# Patient Record
Sex: Female | Born: 1995 | Hispanic: Yes | Marital: Single | Smoking: Never smoker
Health system: Southern US, Community
[De-identification: ages and names within clinical notes are randomized; demographics above are authoritative.]

## PROBLEM LIST (undated history)

## (undated) ENCOUNTER — Inpatient Hospital Stay (HOSPITAL_COMMUNITY): Payer: Self-pay

## (undated) DIAGNOSIS — Z789 Other specified health status: Secondary | ICD-10-CM

## (undated) DIAGNOSIS — O24419 Gestational diabetes mellitus in pregnancy, unspecified control: Secondary | ICD-10-CM

## (undated) DIAGNOSIS — E785 Hyperlipidemia, unspecified: Secondary | ICD-10-CM

## (undated) HISTORY — PX: NO PAST SURGERIES: SHX2092

## (undated) HISTORY — DX: Other specified health status: Z78.9

---
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
## 2020-03-11 LAB — OB RESULTS CONSOLE HIV ANTIBODY (ROUTINE TESTING): HIV: NONREACTIVE

## 2020-03-11 LAB — OB RESULTS CONSOLE HEPATITIS B SURFACE ANTIGEN: Hepatitis B Surface Ag: NEGATIVE

## 2020-03-11 LAB — OB RESULTS CONSOLE RUBELLA ANTIBODY, IGM: Rubella: IMMUNE

## 2020-03-11 LAB — HEPATITIS C ANTIBODY: HCV Ab: NEGATIVE

## 2020-03-12 ENCOUNTER — Other Ambulatory Visit: Payer: Self-pay | Admitting: Obstetrics & Gynecology

## 2020-03-12 DIAGNOSIS — Z36 Encounter for antenatal screening for chromosomal anomalies: Secondary | ICD-10-CM

## 2020-03-15 ENCOUNTER — Ambulatory Visit: Payer: Self-pay | Attending: Obstetrics and Gynecology

## 2020-03-15 ENCOUNTER — Ambulatory Visit: Payer: Self-pay | Admitting: Genetic Counselor

## 2020-03-15 ENCOUNTER — Ambulatory Visit: Payer: Self-pay | Admitting: *Deleted

## 2020-03-15 ENCOUNTER — Other Ambulatory Visit: Payer: Self-pay

## 2020-03-15 VITALS — BP 119/69 | HR 76

## 2020-03-15 DIAGNOSIS — G40909 Epilepsy, unspecified, not intractable, without status epilepticus: Secondary | ICD-10-CM

## 2020-03-15 DIAGNOSIS — Z87898 Personal history of other specified conditions: Secondary | ICD-10-CM

## 2020-03-15 DIAGNOSIS — O99352 Diseases of the nervous system complicating pregnancy, second trimester: Secondary | ICD-10-CM

## 2020-03-15 DIAGNOSIS — Z363 Encounter for antenatal screening for malformations: Secondary | ICD-10-CM

## 2020-03-15 DIAGNOSIS — O0990 Supervision of high risk pregnancy, unspecified, unspecified trimester: Secondary | ICD-10-CM | POA: Insufficient documentation

## 2020-03-15 DIAGNOSIS — Z3A20 20 weeks gestation of pregnancy: Secondary | ICD-10-CM

## 2020-03-15 DIAGNOSIS — Z36 Encounter for antenatal screening for chromosomal anomalies: Secondary | ICD-10-CM | POA: Insufficient documentation

## 2020-03-15 DIAGNOSIS — O321XX0 Maternal care for breech presentation, not applicable or unspecified: Secondary | ICD-10-CM

## 2020-03-15 DIAGNOSIS — I10 Essential (primary) hypertension: Secondary | ICD-10-CM

## 2020-03-15 DIAGNOSIS — O10912 Unspecified pre-existing hypertension complicating pregnancy, second trimester: Secondary | ICD-10-CM

## 2020-03-15 NOTE — Progress Notes (Signed)
Ms. Wanda Palmer declined a formal genetic counseling consultation today. However, we did briefly discuss the reason she was referred for genetic counseling. Ms. Wanda Palmer was referred for a personal history of seizures. She reported having a few seizures several years ago prior to her first pregnancy. Per records, she also had a history of hypertension around that time. She was treated with anti-epileptics for approximately one year. She has not had any seizures since that time and is no longer taking medications.   Seizures can occur secondary to a variety of environmental, lifestyle, and genetic factors. Recent research has suggested that hypertension may be a cause of seizures in some individuals Wanda Palmer al., 2019). When epilepsy does not have an identified genetic cause, the chance that a child of an affected mother will also have or develop epilepsy is approximately 4%. However, without knowing the exact etiology of Wanda Palmer's seizures, precise risk assessment is limited.  I asked Ms. Wanda Palmer and her partner, Wanda Palmer, if they had any other personal or family history concerns that they would like to review. Mr. Wanda Palmer reported that his father died of a heart attack in his 59s. We discussed that, like seizures, many forms of heart disease are multifactorial in nature, occurring due to a combination of genetic, lifestyle, and environmental factors. Known risk factors for heart attacks include substance abuse, smoking, hypertension, high cholesterol, lack of physical activity, diabetes, and poor diet. However, some families appear to have a strong family history of heart disease. Given that no one else in the family has a history of heart disease or heart attacks, Wanda Palmer's father's heart attack was likely multifactorial in nature. For this reason, precise recurrence risk for Mr. Wanda Palmer and his children cannot be determined.   I provided Ms. Wanda Palmer  with my card and encouraged her to contact me if any further questions or concerns arise. She and her partner confirmed that they had no further questions for me today. Today's discussion was facilitated by a [facility] Spanish interpreter.

## 2020-06-26 ENCOUNTER — Encounter (HOSPITAL_COMMUNITY): Payer: Self-pay | Admitting: Obstetrics and Gynecology

## 2020-06-26 ENCOUNTER — Other Ambulatory Visit: Payer: Self-pay

## 2020-06-26 ENCOUNTER — Inpatient Hospital Stay (HOSPITAL_COMMUNITY)
Admission: AD | Admit: 2020-06-26 | Discharge: 2020-06-26 | Disposition: A | Discharge status: Home / Self Care | Payer: Self-pay | Attending: Obstetrics and Gynecology | Admitting: Obstetrics and Gynecology

## 2020-06-26 DIAGNOSIS — O479 False labor, unspecified: Secondary | ICD-10-CM

## 2020-06-26 DIAGNOSIS — O4703 False labor before 37 completed weeks of gestation, third trimester: Secondary | ICD-10-CM

## 2020-06-26 DIAGNOSIS — O26893 Other specified pregnancy related conditions, third trimester: Secondary | ICD-10-CM

## 2020-06-26 DIAGNOSIS — Z3A35 35 weeks gestation of pregnancy: Secondary | ICD-10-CM

## 2020-06-26 DIAGNOSIS — N898 Other specified noninflammatory disorders of vagina: Secondary | ICD-10-CM

## 2020-06-26 DIAGNOSIS — R102 Pelvic and perineal pain: Secondary | ICD-10-CM | POA: Insufficient documentation

## 2020-06-26 DIAGNOSIS — Z7982 Long term (current) use of aspirin: Secondary | ICD-10-CM | POA: Insufficient documentation

## 2020-06-26 LAB — POCT FERN TEST: POCT Fern Test: NEGATIVE

## 2020-06-26 NOTE — MAU Note (Signed)
. °  Wanda Palmer is a 24 y.o. at [redacted]w[redacted]d here in MAU reporting: she had LOF after she got out of the shower about an hour ago. Denies any pain..   Onset of complaint: an hour Pain score: 0 Vitals:   06/26/20 2040  BP: 133/77  Pulse: 89  Resp: 16  Temp: 98.5 F (36.9 C)     FHT:135 Lab orders placed from triage:

## 2020-06-26 NOTE — Discharge Instructions (Signed)
Contracciones de Braxton Hicks °Braxton Hicks Contractions °Las contracciones del útero pueden presentarse durante todo el embarazo, pero no siempre indican que la mujer está de parto. Es posible que usted haya tenido contracciones de práctica llamadas "contracciones de Braxton Hicks". A veces, se las confunde con el parto real. °¿Qué son las contracciones de Braxton Hicks? °Las contracciones de Braxton Hicks son espasmos que se producen en los músculos del útero antes del parto. A diferencia de las contracciones del parto verdadero, estas no producen el agrandamiento (la dilatación) ni el afinamiento del cuello uterino. Hacia el final del embarazo (entre las semanas 32 y 34), las contracciones de Braxton Hicks pueden presentarse más seguido y tornarse más intensas. A veces, resulta difícil distinguirlas del parto verdadero porque pueden ser muy molestas. No debe sentirse avergonzada si concurre al hospital con falso parto. °En ocasiones, la única forma de saber si el trabajo de parto es verdadero es que el médico determine si hay cambios en el cuello del útero. El médico le hará un examen físico y quizás le controle las contracciones. Si usted no está de parto verdadero, el examen debe indicar que el cuello uterino no está dilatado y que usted no ha roto bolsa. °Si no hay otros problemas de salud asociados con su embarazo, no habrá inconvenientes si la envían a su casa con un falso parto. Es posible que las contracciones de Braxton Hicks continúen hasta que se desencadene el parto verdadero. °Cómo diferenciar el trabajo de parto falso del verdadero °Trabajo de parto verdadero °· Las contracciones duran de 30 a 70 segundos. °· Las contracciones pueden tornarse muy regulares. °· La molestia generalmente se siente en la parte superior del útero y se extiende hacia la zona baja del abdomen y hacia la cintura. °· Las contracciones no desaparecen cuando usted camina. °· Las contracciones generalmente se hacen más  intensas y aumentan en frecuencia. °· El cuello uterino se dilata y se afina. °Parto falso °· En general, las contracciones son más cortas y no tan intensas como las del parto verdadero. °· En general, las contracciones son irregulares. °· A menudo, las contracciones se sienten en la parte delantera de la parte baja del abdomen y en la ingle. °· Las contracciones pueden desaparecer cuando usted camina o cambia de posición mientras está acostada. °· Las contracciones se vuelven más débiles y su duración es menor a medida que transcurre el tiempo. °· En general, el cuello uterino no se dilata ni se afina. °Siga estas indicaciones en su casa: ° °· Tome los medicamentos de venta libre y los recetados solamente como se lo haya indicado el médico. °· Continúe haciendo los ejercicios habituales y siga las demás indicaciones que el médico le dé. °· Coma y beba con moderación si cree que está de parto. °· Si las contracciones de Braxton Hicks le provocan incomodidad: °? Cambie de posición: si está acostada o descansando, camine; si está caminando, descanse. °? Siéntese y descanse en una bañera con agua tibia. °? Beba suficiente líquido como para mantener la orina de color amarillo pálido. La deshidratación puede provocar contracciones. °? Respire lenta y profundamente varias veces por hora. °· Vaya a todas las visitas de control prenatales y de control como se lo haya indicado el médico. Esto es importante. °Comuníquese con un médico si: °· Tiene fiebre. °· Siente dolor constante en el abdomen. °Solicite ayuda de inmediato si: °· Las contracciones se intensifican, se hacen más regulares y cercanas entre sí. °· Tiene una pérdida de líquido por la vagina. °· Elimina   una mucosidad sanguinolenta (prdida del tapn mucoso).  Tiene una hemorragia vaginal.  Tiene un dolor en la zona lumbar que nunca tuvo antes.  Siente que la cabeza del beb empuja hacia abajo y ejerce presin en la zona plvica.  El beb no se mueve tanto  como antes. Resumen  Las [employer] se presentan antes del parto se conocen como contracciones de [place] parto o contracciones de Multimedia programmer.  En general, las contracciones de [address] son ms cortas, ms dbiles, con ms tiempo entre una y [place], y menos regulares que las contracciones del parto verdadero. Las contracciones del parto verdadero se intensifican progresivamente y se tornan regulares y ms frecuentes.  Para controlar la [employer] producen las contracciones de [place], puede cambiar de posicin, darse un bao templado y Lawyer, beber mucha agua o practicar la respiracin profunda. Esta informacin no tiene Theme park manager el consejo del mdico. Asegrese de hacerle al mdico cualquier pregunta que tenga. Document Revised: 12/14/2017 Document Reviewed: 04/16/2017 Elsevier Patient Education  2020 [employer].  Informacin sobre parto y Passenger transport manager de parto prematuros Preterm Labor and Birth Information El embarazo tiene generalmente una duracin de 39 a 41 semanas. El [place] de parto es prematuro cuando se inicia muy pronto. Comienza antes de completar las 37 semanas de [place]. Cules son los factores de riesgo del [place] prematuro? Existen mayores probabilidades de trabajo de parto prematuro en mujeres con las siguientes caractersticas:  Tuvieron una infeccin Academic librarian.  El cuello uterino es corto.  Tuvieron trabajo de parto prematuro anteriormente.  Se sometieron a una ciruga en el cuello uterino.  Son menores de 17aos.  Tienen ms de 35aos.  Son afroamericanas.  Estn embarazadas de dos o ms bebs.  Consumen drogas mientras estn embarazadas.  Fuman mientras estn embarazadas.  No aumentan de peso lo suficiente durante el [employer].  Se embarazaron inmediatamente despus de Nurse, mental health. Cules son los sntomas del [place] de [place] prematuro? Los sntomas del trabajo de parto prematuro  incluyen lo siguiente:  Educational psychologist. Los calambres pueden parecerse a los que tiene una mujer durante el perodo menstrual. Los calambres pueden presentarse con diarrea.  Dolor de vientre (abdomen).  Dolor en la zona lumbar.  Tiene contracciones regulares o endurecimiento del tero. Siente como si el vientre se endurece.  Presin en la zona inferior del vientre que Advertising account executive.  Pierde ms lquido (secrecin) por la vagina. El lquido puede ser acuoso o con [place].  Ruptura de la bolsa de aguas. Por qu es importante notar los signos del [place] de [place] prematuro? Los bebs que nacen antes de tiempo pueden no estar completamente desarrollados. Estos pueden tener un riesgo mayor de padecer:  Problemas cardacos a Air cabin crew.  Problemas pulmonares a Air cabin crew.  Dificultades para controlar los sistemas corporales, por ejemplo, respirar.  Hemorragia cerebral.  Una afeccin que se denomina parlisis cerebral.  Dificultades en el aprendizaje.  Muerte. Estos riesgos son [employer] para bebs que nacen antes de las 34semanas de [place]. Cmo se trata [place]ie [place]ike de parto prematuro? El tratamiento depende de lo siguiente:  El tiempo de [place].  Su estado de [place].  La salud del beb. El tratamiento puede incluir lo siguiente:  Un punto (sutura) en el cuello uterino. Al parir, el cuello uterino se abre para que el beb pueda salir. El punto impide que el cuello uterino se abra antes de [place].  Permanecer en el hospital.  Tomar medicamentos como, por ejemplo: ?  Medicamentos hormonales. ? Medicamentos para TEFL teacher las contracciones. ? Medicamentos para ayudar a la maduracin de los pulmones del beb. ? Medicamentos para evitar que el beb desarrolle parlisis cerebral. Qu debo hacer si estoy en [place] prematuro? Si cree que est en trabajo de parto demasiado pronto, llame a su mdico de inmediato. Cmo puedo prevenir el trabajo de parto  prematuro?  No use productos que contengan tabaco. ? Estos incluyen cigarrillos, tabaco para Theatre manager y Administrator, Civil Service. ? Si necesita ayuda para dejar de fumar, consulte al mdico.  No consuma drogas.  No tome ningn medicamento si el mdico no se lo indic.  Consulte al mdico antes de empezar a tomar cualquier suplemento de hierbas.  Asegrese aumentar de peso como corresponde.  Tenga cuidado con las infecciones. Si cree que puede tener una infeccin, consulte al mdico para que la revisen inmediatamente.  Infrmele al mdico si ha tenido trabajo de parto prematuro anteriormente. Esta informacin no tiene Theme park manager el consejo del mdico. Asegrese de hacerle al mdico cualquier pregunta que tenga. Document Revised: 12/13/2016 Document Reviewed: 01/26/2016 Elsevier Patient Education  2020 [employer].

## 2020-06-26 NOTE — MAU Provider Note (Signed)
Chief Complaint:  Rupture of Membranes   First Provider Initiated Contact with Patient 06/26/20 2105     HPI: Wanda Palmer is a 24 y.o. G2P1001 at 72w2dwho presents to maternity admissions reporting feeling like she was leaking in the shower.  States shower was one temperature and a different temperature was felt on her leg.  Also reports mild pain in pelvis.  States pain goes away when she lies on her side. . She reports good fetal movement, denies vaginal bleeding, vaginal itching/burning, urinary symptoms, h/a, dizziness, n/v, diarrhea, constipation or fever/chills.    Vaginal Discharge The patient's primary symptoms include pelvic pain and vaginal discharge. The patient's pertinent negatives include no genital itching, genital lesions, genital odor or vaginal bleeding. This is a new problem. The current episode started today. The problem occurs intermittently. The pain is mild. She is pregnant. Associated symptoms include abdominal pain (intermittent). Pertinent negatives include no chills, constipation, diarrhea, dysuria, fever, nausea or vomiting. The vaginal discharge was clear. There has been no bleeding. She has not been passing clots. She has not been passing tissue. Nothing aggravates the symptoms. She has tried nothing for the symptoms.    RN Note Wanda Palmer is a 24 y.o. at [redacted]w[redacted]d here in MAU reporting: she had LOF after she got out of the shower about an hour ago. Denies any pain..  Past Medical History: Past Medical History:  Diagnosis Date  . Medical history non-contributory     Past obstetric history: OB History  Gravida Para Term Preterm AB Living  2 1 1     1   SAB TAB Ectopic Multiple Live Births          1    # Outcome Date GA Lbr Len/2nd Weight Sex Delivery Anes PTL Lv  2 Current           1 Term     M Vag-Spont   LIV    Past Surgical History: Past Surgical History:  Procedure Laterality Date  . NO PAST SURGERIES      Family  History: No family history on file.  Social History: Social History   Tobacco Use  . Smoking status: Never Smoker  Substance Use Topics  . Alcohol use: Never  . Drug use: Never    Allergies: No Known Allergies  Meds:  Medications Prior to Admission  Medication Sig Dispense Refill Last Dose  . aspirin 81 MG chewable tablet Chew 81 mg by mouth daily.     . Prenatal Vit-Fe Fumarate-FA (PRENATAL MULTIVITAMIN) TABS tablet Take 1 tablet by mouth daily at 12 noon.       I have reviewed patient's Past Medical Hx, Surgical Hx, Family Hx, Social Hx, medications and allergies.   ROS:  Review of Systems  Constitutional: Negative for chills and fever.  Gastrointestinal: Positive for abdominal pain (intermittent). Negative for constipation, diarrhea, nausea and vomiting.  Genitourinary: Positive for pelvic pain and vaginal discharge. Negative for dysuria.   Other systems negative  Physical Exam   Patient Vitals for the past 24 hrs:  BP Temp Pulse Resp Weight  06/26/20 2040 133/77 98.5 F (36.9 C) 89 16 86.2 kg   Constitutional: Well-developed, well-nourished female in no acute distress.  Cardiovascular: normal rate and rhythm Respiratory: normal effort, clear to auscultation bilaterally GI: Abd soft, non-tender, gravid appropriate for gestational age.   No rebound or guarding. MS: Extremities nontender, no edema, normal ROM Neurologic: Alert and oriented x 4.  GU: Neg CVAT.  PELVIC EXAM:  Cervix pink, visually closed, without lesion, scant white creamy discharge, vaginal walls and external genitalia normal     No pooling, no ferning   Dilation: Closed Effacement (%): 50 Cervical Position: Posterior Exam by:: Mayford Knife CNM   FHT:  Baseline 140 , moderate variability, accelerations present, no decelerations Contractions: Irregular, not felt as strong by the patient   Labs: No results found for this or any previous visit (from the past 24 hour(s)).    Imaging:  No results  found.  MAU Course/MDM: Evaluation is negative for ferning and pooling NST reviewed, reactive  Treatments in MAU included EFM, SSE.    Assessment: Single IUP at [redacted]w[redacted]d Vaginal discharge, no evidence of PPROM Braxton Hicks contractions, no change in cervix  Plan: Discharge home Preterm Labor precautions and fetal kick counts Follow up in Office for prenatal visits  Encouraged to return here or to other Urgent Care/ED if she develops worsening of symptoms, increase in pain, fever, or other concerning symptoms.   Pt stable at time of discharge.  Wynelle Bourgeois CNM, MSN Certified Nurse-Midwife 06/26/2020 9:06 PM

## 2020-07-05 LAB — OB RESULTS CONSOLE GBS: GBS: NEGATIVE

## 2020-07-16 ENCOUNTER — Encounter (HOSPITAL_COMMUNITY): Payer: Self-pay | Admitting: Obstetrics & Gynecology

## 2020-07-16 ENCOUNTER — Inpatient Hospital Stay (HOSPITAL_COMMUNITY)
Admission: AD | Admit: 2020-07-16 | Discharge: 2020-07-16 | Disposition: A | Discharge status: Home / Self Care | Payer: Self-pay | Attending: Obstetrics & Gynecology | Admitting: Obstetrics & Gynecology

## 2020-07-16 ENCOUNTER — Other Ambulatory Visit: Payer: Self-pay

## 2020-07-16 DIAGNOSIS — Z3A38 38 weeks gestation of pregnancy: Secondary | ICD-10-CM

## 2020-07-16 DIAGNOSIS — O471 False labor at or after 37 completed weeks of gestation: Secondary | ICD-10-CM

## 2020-07-16 DIAGNOSIS — Z3689 Encounter for other specified antenatal screening: Secondary | ICD-10-CM

## 2020-07-16 DIAGNOSIS — O4703 False labor before 37 completed weeks of gestation, third trimester: Secondary | ICD-10-CM

## 2020-07-16 DIAGNOSIS — O26893 Other specified pregnancy related conditions, third trimester: Secondary | ICD-10-CM

## 2020-07-16 DIAGNOSIS — G44209 Tension-type headache, unspecified, not intractable: Secondary | ICD-10-CM

## 2020-07-16 DIAGNOSIS — O99353 Diseases of the nervous system complicating pregnancy, third trimester: Secondary | ICD-10-CM | POA: Insufficient documentation

## 2020-07-16 LAB — WET PREP, GENITAL
Clue Cells Wet Prep HPF POC: NONE SEEN
Sperm: NONE SEEN
Trich, Wet Prep: NONE SEEN
Yeast Wet Prep HPF POC: NONE SEEN

## 2020-07-16 LAB — POCT FERN TEST: POCT Fern Test: NEGATIVE

## 2020-07-16 MED ORDER — PROMETHAZINE HCL 25 MG/ML IJ SOLN
12.5000 mg | Freq: Once | INTRAMUSCULAR | Status: AC
  Administered 2020-07-16: 12.5 mg via INTRAVENOUS
  Filled 2020-07-16: qty 1

## 2020-07-16 MED ORDER — DIPHENHYDRAMINE HCL 50 MG/ML IJ SOLN
25.0000 mg | Freq: Once | INTRAMUSCULAR | Status: AC
  Administered 2020-07-16: 25 mg via INTRAVENOUS
  Filled 2020-07-16: qty 1

## 2020-07-16 MED ORDER — SODIUM CHLORIDE 0.9 % IV BOLUS
500.0000 mL | Freq: Once | INTRAVENOUS | Status: AC
  Administered 2020-07-16: 500 mL via INTRAVENOUS

## 2020-07-16 MED ORDER — DEXAMETHASONE SODIUM PHOSPHATE 10 MG/ML IJ SOLN
10.0000 mg | Freq: Once | INTRAMUSCULAR | Status: AC
  Administered 2020-07-16: 10 mg via INTRAVENOUS
  Filled 2020-07-16: qty 1

## 2020-07-16 NOTE — MAU Provider Note (Signed)
Chief Complaint:  Headache, Contractions, and Rupture of Membranes   None    HPI: Wanda Palmer is a 24 y.o. G2P1001 at [redacted]w[redacted]d who presents to maternity admissions reporting severe headache and pelvic pressure, both began earlier today. She also reports she is leaking fluid and has been all day.   Past Medical History:  Diagnosis Date  . Medical history non-contributory    OB History  Gravida Para Term Preterm AB Living  2 1 1  0 0 1  SAB TAB Ectopic Multiple Live Births  0 0 0   1    # Outcome Date GA Lbr Len/2nd Weight Sex Delivery Anes PTL Lv  2 Current           1 Term     M Vag-Spont   LIV   Past Surgical History:  Procedure Laterality Date  . NO PAST SURGERIES     History reviewed. No pertinent family history. Social History   Tobacco Use  . Smoking status: Never Smoker  . Smokeless tobacco: Never Used  Vaping Use  . Vaping Use: Never used  Substance Use Topics  . Alcohol use: Never  . Drug use: Never   Allergies  Allergen Reactions  . Pork-Derived Products    Medications Prior to Admission  Medication Sig Dispense Refill Last Dose  . acetaminophen (TYLENOL) 325 MG tablet Take 650 mg by mouth every 6 (six) hours as needed.    at 1300  . Prenatal Vit-Fe Fumarate-FA (PRENATAL MULTIVITAMIN) TABS tablet Take 1 tablet by mouth daily at 12 noon.   07/16/2020 at Unknown time  . Prenatal Vit-Fe Fumarate-FA (PRENATAL VITAMIN PO) Take by mouth.   07/16/2020 at Unknown time  . aspirin 81 MG chewable tablet Chew 81 mg by mouth daily.   More than a month at Unknown time    I have reviewed patient's Past Medical Hx, Surgical Hx, Family Hx, Social Hx, medications and allergies.   ROS:  Review of Systems  Constitutional: Negative for fatigue and fever.  HENT: Negative for congestion and sore throat.   Eyes: Negative for visual disturbance.  Respiratory: Negative for shortness of breath.   Gastrointestinal: Negative for constipation, diarrhea and nausea.   Genitourinary: Positive for pelvic pain and vaginal discharge (watery fluid). Negative for vaginal bleeding.  Neurological: Positive for headaches.  All other systems reviewed and are negative.   Physical Exam   Patient Vitals for the past 24 hrs:  BP Pulse Resp SpO2 Height Weight  07/16/20 1814 -- -- -- 97 % -- --  07/16/20 1611 119/72 99 18 100 % 5' 3.5" (1.613 m) 193 lb 8 oz (87.8 kg)    Constitutional: Well-developed, well-nourished female in no acute distress.  Cardiovascular: normal rate & rhythm, no murmur Respiratory: normal effort, lung sounds clear throughout GI: Abd soft, non-tender, gravid appropriate for gestational age. Pos BS x 4 MS: Extremities nontender, no edema, normal ROM Neurologic: Alert and oriented x 4.  Pelvic: NEFG, physiologic discharge, no blood, cervix clean.   Dilation: Closed Effacement (%): Thick Cervical Position: Posterior Exam by:: [email or web address], RN   Fetal Tracing: reactive Baseline: 135 Variability: moderate Accelerations: present Decelerations: none Toco: UI   Labs: Results for orders placed or performed during the hospital encounter of 07/16/20 (from the past 24 hour(s))  Wet prep, genital     Status: Abnormal   Collection Time: 07/16/20  5:02 PM  Result Value Ref Range   Yeast Wet Prep HPF POC NONE SEEN NONE SEEN  Trich, Wet Prep NONE SEEN NONE SEEN   Clue Cells Wet Prep HPF POC NONE SEEN NONE SEEN   WBC, Wet Prep HPF POC MODERATE (A) NONE SEEN   Sperm NONE SEEN   Fern Test     Status: None   Collection Time: 07/16/20  7:05 PM  Result Value Ref Range   POCT Fern Test Negative = intact amniotic membranes     Imaging:  No results found.  MAU Course: Orders Placed This Encounter  Procedures  . Wet prep, genital  . Fern Test  . Insert peripheral IV  . Discharge patient   Meds ordered this encounter  Medications  . AND Linked Order Group   . diphenhydrAMINE (BENADRYL) injection 25 mg   . dexamethasone (DECADRON)  injection 10 mg  . promethazine (PHENERGAN) injection 12.5 mg  . sodium chloride 0.9 % bolus 500 mL    MDM: Headache cocktail caused some dizziness, but reduced both headache and abdominal discomfort.  Assessment: 1. Acute non intractable tension-type headache   2. False labor after 37 completed weeks of gestation   3. NST (non-stress test) reactive    Plan: Discharge home in stable condition with return precautions.     Follow-up Information    Department, [facility] Go to.   Why: as scheduled for ongoing prenatal care Contact information: [address] [place] [address] [phone]               Allergies as of 07/16/2020      Reactions   Pork-derived Products       Medication List    TAKE these medications   acetaminophen 325 MG tablet Commonly known as: TYLENOL Take 650 mg by mouth every 6 (six) hours as needed.   aspirin 81 MG chewable tablet Chew 81 mg by mouth daily.   prenatal multivitamin Tabs tablet Take 1 tablet by mouth daily at 12 noon.   PRENATAL VITAMIN PO Take by mouth.      Edd Arbour, CNM, MSN, [facility] 07/16/20 7:28 PM

## 2020-07-16 NOTE — Discharge Instructions (Signed)
Contracciones de Braxton Hicks °Braxton Hicks Contractions °Las contracciones del útero pueden presentarse durante todo el embarazo, pero no siempre indican que la mujer está de parto. Es posible que usted haya tenido contracciones de práctica llamadas "contracciones de Braxton Hicks". A veces, se las confunde con el parto real. °¿Qué son las contracciones de Braxton Hicks? °Las contracciones de Braxton Hicks son espasmos que se producen en los músculos del útero antes del parto. A diferencia de las contracciones del parto verdadero, estas no producen el agrandamiento (la dilatación) ni el afinamiento del cuello uterino. Hacia el final del embarazo (entre las semanas 32 y 34), las contracciones de Braxton Hicks pueden presentarse más seguido y tornarse más intensas. A veces, resulta difícil distinguirlas del parto verdadero porque pueden ser muy molestas. No debe sentirse avergonzada si concurre al hospital con falso parto. °En ocasiones, la única forma de saber si el trabajo de parto es verdadero es que el médico determine si hay cambios en el cuello del útero. El médico le hará un examen físico y quizás le controle las contracciones. Si usted no está de parto verdadero, el examen debe indicar que el cuello uterino no está dilatado y que usted no ha roto bolsa. °Si no hay otros problemas de salud asociados con su embarazo, no habrá inconvenientes si la envían a su casa con un falso parto. Es posible que las contracciones de Braxton Hicks continúen hasta que se desencadene el parto verdadero. °Cómo diferenciar el trabajo de parto falso del verdadero °Trabajo de parto verdadero °· Las contracciones duran de 30 a 70 segundos. °· Las contracciones pueden tornarse muy regulares. °· La molestia generalmente se siente en la parte superior del útero y se extiende hacia la zona baja del abdomen y hacia la cintura. °· Las contracciones no desaparecen cuando usted camina. °· Las contracciones generalmente se hacen más  intensas y aumentan en frecuencia. °· El cuello uterino se dilata y se afina. °Parto falso °· En general, las contracciones son más cortas y no tan intensas como las del parto verdadero. °· En general, las contracciones son irregulares. °· A menudo, las contracciones se sienten en la parte delantera de la parte baja del abdomen y en la ingle. °· Las contracciones pueden desaparecer cuando usted camina o cambia de posición mientras está acostada. °· Las contracciones se vuelven más débiles y su duración es menor a medida que transcurre el tiempo. °· En general, el cuello uterino no se dilata ni se afina. °Siga estas indicaciones en su casa: ° °· Tome los medicamentos de venta libre y los recetados solamente como se lo haya indicado el médico. °· Continúe haciendo los ejercicios habituales y siga las demás indicaciones que el médico le dé. °· Coma y beba con moderación si cree que está de parto. °· Si las contracciones de Braxton Hicks le provocan incomodidad: °? Cambie de posición: si está acostada o descansando, camine; si está caminando, descanse. °? Siéntese y descanse en una bañera con agua tibia. °? Beba suficiente líquido como para mantener la orina de color amarillo pálido. La deshidratación puede provocar contracciones. °? Respire lenta y profundamente varias veces por hora. °· Vaya a todas las visitas de control prenatales y de control como se lo haya indicado el médico. Esto es importante. °Comuníquese con un médico si: °· Tiene fiebre. °· Siente dolor constante en el abdomen. °Solicite ayuda de inmediato si: °· Las contracciones se intensifican, se hacen más regulares y cercanas entre sí. °· Tiene una pérdida de líquido por la vagina. °· Elimina   una mucosidad sanguinolenta (pérdida del tapón mucoso). °· Tiene una hemorragia vaginal. °· Tiene un dolor en la zona lumbar que nunca tuvo antes. °· Siente que la cabeza del bebé empuja hacia abajo y ejerce presión en la zona pélvica. °· El bebé no se mueve tanto  como antes. °Resumen °· Las contracciones que se presentan antes del parto se conocen como contracciones de Braxton Hicks, falso parto o contracciones de práctica. °· En general, las contracciones de Braxton Hicks son más cortas, más débiles, con más tiempo entre una y otra, y menos regulares que las contracciones del parto verdadero. Las contracciones del parto verdadero se intensifican progresivamente y se tornan regulares y más frecuentes. °· Para controlar la molestia que producen las contracciones de Braxton Hicks, puede cambiar de posición, darse un baño templado y descansar, beber mucha agua o practicar la respiración profunda. °Esta información no tiene como fin reemplazar el consejo del médico. Asegúrese de hacerle al médico cualquier pregunta que tenga. °Document Revised: 12/14/2017 Document Reviewed: 04/16/2017 °Elsevier Patient Education © 2020 Elsevier Inc. ° °

## 2020-07-16 NOTE — MAU Note (Signed)
Pt feeling dizzy after receiving Benadryl and Phenergan.  Laid back with cool washcloth on forehead.  VSS.  Held Dexamethasone while further assessing patient and speaking through [employer].  Also discussed patient with Edd Arbour, CNM.  Okay with provider and with patient to proceed with Dexamethasone.  Pt remains laying flat, lights down and resting. Call light within reach and pt husband at bedside.  VSS.

## 2020-07-16 NOTE — MAU Note (Signed)
Since this morning has been having pain  In abd, feeling a lot of pressure. ? Leaking fluid- first noted at ~0700.  Having a severe headache, Tylenol has not helped.

## 2020-07-28 ENCOUNTER — Inpatient Hospital Stay (HOSPITAL_COMMUNITY)
Admission: AD | Admit: 2020-07-28 | Discharge: 2020-07-31 | DRG: 806 | Disposition: A | Discharge status: Home / Self Care | Payer: Self-pay | Attending: Family Medicine | Admitting: Family Medicine

## 2020-07-28 ENCOUNTER — Other Ambulatory Visit: Payer: Self-pay

## 2020-07-28 ENCOUNTER — Encounter (HOSPITAL_COMMUNITY): Payer: Self-pay | Admitting: Obstetrics and Gynecology

## 2020-07-28 DIAGNOSIS — D6959 Other secondary thrombocytopenia: Secondary | ICD-10-CM | POA: Diagnosis present

## 2020-07-28 DIAGNOSIS — O134 Gestational [pregnancy-induced] hypertension without significant proteinuria, complicating childbirth: Principal | ICD-10-CM | POA: Diagnosis present

## 2020-07-28 DIAGNOSIS — Z20822 Contact with and (suspected) exposure to covid-19: Secondary | ICD-10-CM | POA: Diagnosis present

## 2020-07-28 DIAGNOSIS — O99113 Other diseases of the blood and blood-forming organs and certain disorders involving the immune mechanism complicating pregnancy, third trimester: Secondary | ICD-10-CM

## 2020-07-28 DIAGNOSIS — Z3689 Encounter for other specified antenatal screening: Secondary | ICD-10-CM

## 2020-07-28 DIAGNOSIS — O133 Gestational [pregnancy-induced] hypertension without significant proteinuria, third trimester: Secondary | ICD-10-CM

## 2020-07-28 DIAGNOSIS — O139 Gestational [pregnancy-induced] hypertension without significant proteinuria, unspecified trimester: Secondary | ICD-10-CM | POA: Diagnosis present

## 2020-07-28 DIAGNOSIS — Z3A39 39 weeks gestation of pregnancy: Secondary | ICD-10-CM

## 2020-07-28 DIAGNOSIS — O9912 Other diseases of the blood and blood-forming organs and certain disorders involving the immune mechanism complicating childbirth: Secondary | ICD-10-CM | POA: Diagnosis present

## 2020-07-28 DIAGNOSIS — D696 Thrombocytopenia, unspecified: Secondary | ICD-10-CM | POA: Diagnosis present

## 2020-07-28 DIAGNOSIS — O99119 Other diseases of the blood and blood-forming organs and certain disorders involving the immune mechanism complicating pregnancy, unspecified trimester: Secondary | ICD-10-CM | POA: Diagnosis present

## 2020-07-28 LAB — TYPE AND SCREEN
ABO/RH(D): B POS
Antibody Screen: NEGATIVE

## 2020-07-28 LAB — COMPREHENSIVE METABOLIC PANEL
ALT: 17 U/L (ref 0–44)
AST: 20 U/L (ref 15–41)
Albumin: 2.8 g/dL — ABNORMAL LOW (ref 3.5–5.0)
Alkaline Phosphatase: 137 U/L — ABNORMAL HIGH (ref 38–126)
Anion gap: 9 (ref 5–15)
BUN: 10 mg/dL (ref 6–20)
CO2: 22 mmol/L (ref 22–32)
Calcium: 8.8 mg/dL — ABNORMAL LOW (ref 8.9–10.3)
Chloride: 108 mmol/L (ref 98–111)
Creatinine, Ser: 0.58 mg/dL (ref 0.44–1.00)
GFR, Estimated: >60 mL/min (ref >60)
Glucose, Bld: 100 mg/dL — ABNORMAL HIGH (ref 70–99)
Potassium: 3.8 mmol/L (ref 3.5–5.1)
Sodium: 139 mmol/L (ref 135–145)
Total Bilirubin: 0.3 mg/dL (ref 0.3–1.2)
Total Protein: 6.1 g/dL — ABNORMAL LOW (ref 6.5–8.1)

## 2020-07-28 LAB — CBC
HCT: 33.9 % — ABNORMAL LOW (ref 36.0–46.0)
Hemoglobin: 11.5 g/dL — ABNORMAL LOW (ref 12.0–15.0)
MCH: 31 pg (ref 26.0–34.0)
MCHC: 33.9 g/dL (ref 30.0–36.0)
MCV: 91.4 fL (ref 80.0–100.0)
Platelets: 140 10*3/uL — ABNORMAL LOW (ref 150–400)
RBC: 3.71 MIL/uL — ABNORMAL LOW (ref 3.87–5.11)
RDW: 12.9 % (ref 11.5–15.5)
WBC: 7.6 10*3/uL (ref 4.0–10.5)
nRBC: 0 % (ref 0.0–0.2)

## 2020-07-28 LAB — PROTEIN / CREATININE RATIO, URINE
Creatinine, Urine: 124.07 mg/dL
Protein Creatinine Ratio: 0.17 mg/mg{Cre} — ABNORMAL HIGH (ref 0.00–0.15)
Total Protein, Urine: 21 mg/dL

## 2020-07-28 LAB — RESPIRATORY PANEL BY RT PCR (FLU A&B, COVID)
Influenza A by PCR: NEGATIVE
Influenza B by PCR: NEGATIVE
SARS Coronavirus 2 by RT PCR: NEGATIVE

## 2020-07-28 MED ORDER — TERBUTALINE SULFATE 1 MG/ML IJ SOLN: 0.2500 mg | Freq: Once | INTRAMUSCULAR | Status: DC | PRN

## 2020-07-28 MED ORDER — ACETAMINOPHEN 325 MG PO TABS: 650.0000 mg | ORAL_TABLET | ORAL | Status: DC | PRN

## 2020-07-28 MED ORDER — OXYTOCIN BOLUS FROM INFUSION
333.0000 mL | Freq: Once | INTRAVENOUS | Status: AC
  Administered 2020-07-29: 333 mL via INTRAVENOUS

## 2020-07-28 MED ORDER — OXYCODONE-ACETAMINOPHEN 5-325 MG PO TABS: 1.0000 | ORAL_TABLET | ORAL | Status: DC | PRN

## 2020-07-28 MED ORDER — OXYCODONE-ACETAMINOPHEN 5-325 MG PO TABS: 2.0000 | ORAL_TABLET | ORAL | Status: DC | PRN

## 2020-07-28 MED ORDER — ONDANSETRON HCL 4 MG/2ML IJ SOLN: 4.0000 mg | Freq: Four times a day (QID) | INTRAMUSCULAR | Status: DC | PRN

## 2020-07-28 MED ORDER — OXYTOCIN-SODIUM CHLORIDE 30-0.9 UT/500ML-% IV SOLN
1.0000 m[IU]/min | INTRAVENOUS | Status: DC
  Administered 2020-07-28: 2 m[IU]/min via INTRAVENOUS
  Filled 2020-07-28: qty 500

## 2020-07-28 MED ORDER — LIDOCAINE HCL (PF) 1 % IJ SOLN
30.0000 mL | INTRAMUSCULAR | Status: AC | PRN
  Administered 2020-07-29: 30 mL via SUBCUTANEOUS
  Filled 2020-07-28: qty 30

## 2020-07-28 MED ORDER — LACTATED RINGERS IV SOLN: INTRAVENOUS | Status: DC

## 2020-07-28 MED ORDER — FENTANYL CITRATE (PF) 100 MCG/2ML IJ SOLN: 100.0000 ug | INTRAMUSCULAR | Status: DC | PRN

## 2020-07-28 MED ORDER — SOD CITRATE-CITRIC ACID 500-334 MG/5ML PO SOLN: 30.0000 mL | ORAL | Status: DC | PRN

## 2020-07-28 MED ORDER — LACTATED RINGERS IV SOLN
500.0000 mL | INTRAVENOUS | Status: DC | PRN
  Administered 2020-07-29: 1000 mL via INTRAVENOUS
  Administered 2020-07-29 (×2): 500 mL via INTRAVENOUS

## 2020-07-28 MED ORDER — OXYTOCIN-SODIUM CHLORIDE 30-0.9 UT/500ML-% IV SOLN
2.5000 [IU]/h | INTRAVENOUS | Status: DC
  Administered 2020-07-29: 2.5 [IU]/h via INTRAVENOUS

## 2020-07-28 NOTE — MAU Provider Note (Signed)
History     CSN: [id number]  Arrival date and time: 07/28/20 1441   First Provider Initiated Contact with Patient 07/28/20 1534      Chief Complaint  Patient presents with  . Chills  . Shortness of Breath   Wanda Palmer is a 24 y.o. G2P1001 at 623w6d who presents to MAU for cold sweats, shaking hands, dizziness and her bottom lip moving towards the right without her knowing. Patient reports hx of panic attacks precipitated by headaches about 5 years ago.  Patient reports these symptoms came on around 1pm and reports symptoms lasted about 1 hour, and the issue with her mouth lasted 3 minutes before resolving. Patient reports at this time she is currently experiencing a minor amount of dizziness, but denies other symptoms. Pt reports she called her husband when these symptoms were happening and both deny any difficulties with speech. Patient reports she did not actually see her lip deviate to the right and no other witnesses were present at home at this time. Pt reports prior to this incidence she last ate around 11AM and ate grapes and prior to that she ate cookies and coffee at 9AM. At 115PM patient ate pizza and coke and reports most symptoms resolved. Patient and husband reports she drinks multiple cups of caffeinated coffee per day along with multiple glasses of coke.  Pt denies VB, LOF, ctx, decreased FM, vaginal discharge/odor/itching. Pt denies N/V, abdominal pain, constipation, diarrhea, or urinary problems. Pt denies fever, chills, fatigue, sweating or changes in appetite. Pt denies SOB or chest pain. Pt denies HA, light-headedness, weakness.  Problems this pregnancy include: none. Allergies? pork Current medications/supplements? PNVs Prenatal care provider? [facility], next appt 07/30/2020  Spanish translator used for entire visit.   OB History    Gravida  2   Para  1   Term  1   Preterm  0   AB  0   Living  1     SAB  0   TAB  0   Ectopic  0    Multiple      Live Births  1           Past Medical History:  Diagnosis Date  . Medical history non-contributory     Past Surgical History:  Procedure Laterality Date  . NO PAST SURGERIES      History reviewed. No pertinent family history.  Social History   Tobacco Use  . Smoking status: Never Smoker  . Smokeless tobacco: Never Used  Vaping Use  . Vaping Use: Never used  Substance Use Topics  . Alcohol use: Never  . Drug use: Never    Allergies:  Allergies  Allergen Reactions  . Pork-Derived Products     Medications Prior to Admission  Medication Sig Dispense Refill Last Dose  . Prenatal Vit-Fe Fumarate-FA (PRENATAL VITAMIN PO) Take by mouth.   07/28/2020 at Unknown time  . acetaminophen (TYLENOL) 325 MG tablet Take 650 mg by mouth every 6 (six) hours as needed.     Marland Kitchen. aspirin 81 MG chewable tablet Chew 81 mg by mouth daily.   More than a month at Unknown time  . Prenatal Vit-Fe Fumarate-FA (PRENATAL MULTIVITAMIN) TABS tablet Take 1 tablet by mouth daily at 12 noon.       Review of Systems  Constitutional: Negative for chills, diaphoresis, fatigue and fever.  Eyes: Negative for visual disturbance.  Respiratory: Negative for shortness of breath.   Cardiovascular: Negative for chest pain.  Gastrointestinal:  Negative for abdominal pain, constipation, diarrhea, nausea and vomiting.  Genitourinary: Negative for dysuria, flank pain, frequency, pelvic pain, urgency, vaginal bleeding and vaginal discharge.  Neurological: Positive for dizziness. Negative for weakness, light-headedness and headaches.   Physical Exam   Blood pressure 120/73, pulse 91, temperature 98.1 F (36.7 C), resp. rate 15, last menstrual period 10/23/2019, SpO2 100 %.  Patient Vitals for the past 24 hrs:  BP Temp Pulse Resp SpO2  07/28/20 1816 120/73 -- 91 15 100 %  07/28/20 1801 118/74 -- 95 -- --  07/28/20 1750 104/74 -- (!) 110 -- 99 %  07/28/20 1747 (!) 64/44 -- 90 -- --  07/28/20 1722  124/77 -- (!) 102 -- --  07/28/20 1716 (!) 141/116 -- 100 -- --  07/28/20 1701 137/74 -- 86 -- --  07/28/20 1647 116/77 -- 100 -- --  07/28/20 1616 (!) 107/59 -- 89 -- --  07/28/20 1601 123/77 -- 93 -- --  07/28/20 1546 133/90 -- (!) 106 -- --  07/28/20 1531 133/80 -- 100 -- --  07/28/20 1516 131/77 -- 94 -- --  07/28/20 1511 124/73 -- 88 -- --  07/28/20 1501 130/83 -- (!) 112 -- --  07/28/20 1454 (!) 141/76 -- (!) 111 -- --  07/28/20 1452 138/80 98.1 F (36.7 C) (!) 111 20 100 %   Physical Exam Constitutional:      General: She is not in acute distress.    Appearance: Normal appearance. She is not ill-appearing, toxic-appearing or diaphoretic.  HENT:     Head: Normocephalic and atraumatic.  Eyes:     Pupils: Pupils are equal, round, and reactive to light.  Pulmonary:     Effort: Pulmonary effort is normal.  Neurological:     Mental Status: She is alert and oriented to person, place, and time.  Psychiatric:        Mood and Affect: Mood normal.        Behavior: Behavior normal.        Thought Content: Thought content normal.        Judgment: Judgment normal.    Results for orders placed or performed during the hospital encounter of 07/28/20 (from the past 24 hour(s))  CBC     Status: Abnormal   Collection Time: 07/28/20  3:26 PM  Result Value Ref Range   WBC 7.6 4.0 - 10.5 K/uL   RBC 3.71 (L) 3.87 - 5.11 MIL/uL   Hemoglobin 11.5 (L) 12.0 - 15.0 g/dL   HCT 18.8 (L) 36 - 46 %   MCV 91.4 80.0 - 100.0 fL   MCH 31.0 26.0 - 34.0 pg   MCHC 33.9 30.0 - 36.0 g/dL   RDW 41.6 60.6 - 30.1 %   Platelets 140 (L) 150 - 400 K/uL   nRBC 0.0 0.0 - 0.2 %  Comprehensive metabolic panel     Status: Abnormal   Collection Time: 07/28/20  3:26 PM  Result Value Ref Range   Sodium 139 135 - 145 mmol/L   Potassium 3.8 3.5 - 5.1 mmol/L   Chloride 108 98 - 111 mmol/L   CO2 22 22 - 32 mmol/L   Glucose, Bld 100 (H) 70 - 99 mg/dL   BUN 10 6 - 20 mg/dL   Creatinine, Ser 6.01 0.44 - 1.00 mg/dL    Calcium 8.8 (L) 8.9 - 10.3 mg/dL   Total Protein 6.1 (L) 6.5 - 8.1 g/dL   Albumin 2.8 (L) 3.5 - 5.0 g/dL   AST 20 15 -  41 U/L   ALT 17 0 - 44 U/L   Alkaline Phosphatase 137 (H) 38 - 126 U/L   Total Bilirubin 0.3 0.3 - 1.2 mg/dL   GFR, Estimated >69 >62 mL/min   Anion gap 9 5 - 15  Protein / creatinine ratio, urine     Status: Abnormal   Collection Time: 07/28/20  4:46 PM  Result Value Ref Range   Creatinine, Urine 124.07 mg/dL   Total Protein, Urine 21 mg/dL   Protein Creatinine Ratio 0.17 (H) 0.00 - 0.15 mg/mg[Cre]   No results found.  MAU Course  Procedures  MDM -mild dizziness with previous symptoms resolved -elevated BP of 141/76 and 133/90, ?current/hx of cHTN according to prenatal records; single severe range pressure, but arm was bent during taking and repeat was normal -symptoms include: dizziness -CBC: H/H 11.5/33.9, platelets 140 -CMP: serum creatinine 0.58, AST/ALT 20/17 -CBG: 100 -PCr: 0.17 -EFM: reactive       -baseline: 140/135       -variability: moderate       -accels: present, 15x15       -decels: single early, reviewed with Dr. Alvester Morin       -TOCO: few, irregular ctx, pt not feeling -EKG performed as patient originally presented with chest pain but denies to provider, reviewed with Dr. Alvester Morin, [facility] -consulted with Dr. Shawnie Pons, will admit patient for new onset HTN -called and report given to K. Clelia Croft, CNM -admit to L&D  Orders Placed This Encounter  Procedures  . Respiratory Panel by RT PCR (Flu A&B, Covid) - Nasopharyngeal Swab    Standing Status:   Standing    Number of Occurrences:   1    Order Specific Question:   Is this test for diagnosis or screening    Answer:   Screening    Order Specific Question:   Symptomatic for COVID-19 as defined by CDC    Answer:   No    Order Specific Question:   Hospitalized for COVID-19    Answer:   No    Order Specific Question:   Admitted to ICU for COVID-19    Answer:   No    Order Specific Question:    Previously tested for COVID-19    Answer:   No    Order Specific Question:   Resident in a congregate (group) care setting    Answer:   No    Order Specific Question:   Employed in healthcare setting    Answer:   No    Order Specific Question:   Pregnant    Answer:   Yes    Order Specific Question:   Has patient completed COVID vaccination(s) (2 doses of Pfizer/Moderna 1 dose of [employer])    Answer:   No  . CBC    Standing Status:   Standing    Number of Occurrences:   1  . Comprehensive metabolic panel    Standing Status:   Standing    Number of Occurrences:   1  . Protein / creatinine ratio, urine    Standing Status:   Standing    Number of Occurrences:   1  . EKG 12-Lead    Standing Status:   Standing    Number of Occurrences:   1    Order Specific Question:   Reason for Exam    Answer:   shortness of breath, chest pain     Assessment and Plan   1. Gestational hypertension, third trimester  2. [redacted] weeks gestation of pregnancy   3. NST (non-stress test) reactive   4. Benign gestational thrombocytopenia in third trimester [facility])    -admit to L&D   Wanda Palmer Lugenia Assefa 07/28/2020, 7:08 PM

## 2020-07-28 NOTE — MAU Note (Signed)
.   Wanda Palmer is a 24 y.o. at [redacted]w[redacted]d here in MAU reporting: SOB with chills more than an hour. Denies any VB or LOF. +FM  Onset of complaint: a little over an hour Pain score: 0 Vitals:   07/28/20 1452  BP: 138/80  Pulse: (!) 111  Resp: 20  Temp: 98.1 F (36.7 C)  SpO2: 100%     FHT:160 Lab orders placed from triage:

## 2020-07-28 NOTE — H&P (Addendum)
LABOR AND DELIVERY ADMISSION HISTORY AND PHYSICAL NOTE  Wanda Palmer is a 24 y.o. female G2P1001 with IUP at [redacted]w[redacted]d by LMP c/w [place] presenting for IOL 2/2 gHTN.  She reports positive fetal movement. She denies leakage of fluid or vaginal bleeding.  Prenatal History/Complications: PNC at [facility] Pregnancy complications:  - gHTN (plt 140) - Hx of episiotomy with prior pregnancy (delivered in [place]) - Possible hx of seizure disorder - Positive chlamdyia, TOC negative - Failed 1 hr, passed 3hr gtt  Past Medical History: Past Medical History:  Diagnosis Date  . Medical history non-contributory    Past Surgical History: Past Surgical History:  Procedure Laterality Date  . NO PAST SURGERIES     Obstetrical History: OB History    Gravida  2   Para  1   Term  1   Preterm  0   AB  0   Living  1     SAB  0   TAB  0   Ectopic  0   Multiple      Live Births  1          Social History: Social History   Socioeconomic History  . Marital status: Single    Spouse name: Not on file  . Number of children: Not on file  . Years of education: Not on file  . Highest education level: Not on file  Occupational History  . Not on file  Tobacco Use  . Smoking status: Never Smoker  . Smokeless tobacco: Never Used  Vaping Use  . Vaping Use: Never used  Substance and Sexual Activity  . Alcohol use: Never  . Drug use: Never  . Sexual activity: Not on file  Other Topics Concern  . Not on file  Social History Narrative   ** Merged History Encounter **       Social Determinants of Health   Financial Resource Strain:   . Difficulty of Paying Living Expenses: Not on file  Food Insecurity:   . Worried About Programme researcher, broadcasting/film/video in the Last Year: Not on file  . Ran Out of Food in the Last Year: Not on file  Transportation Needs:   . Lack of Transportation (Medical): Not on file  . Lack of Transportation (Non-Medical): Not on file  Physical Activity:   . Days  of Exercise per Week: Not on file  . Minutes of Exercise per Session: Not on file  Stress:   . Feeling of Stress : Not on file  Social Connections:   . Frequency of Communication with Friends and Family: Not on file  . Frequency of Social Gatherings with Friends and Family: Not on file  . Attends Religious Services: Not on file  . Active Member of Clubs or Organizations: Not on file  . Attends Banker Meetings: Not on file  . Marital Status: Not on file   Family History: History reviewed. No pertinent family history.  Allergies: Allergies  Allergen Reactions  . Pork-Derived Products     Medications Prior to Admission  Medication Sig Dispense Refill Last Dose  . Prenatal Vit-Fe Fumarate-FA (PRENATAL VITAMIN PO) Take by mouth.   07/28/2020 at Unknown time  . acetaminophen (TYLENOL) 325 MG tablet Take 650 mg by mouth every 6 (six) hours as needed.     Marland Kitchen aspirin 81 MG chewable tablet Chew 81 mg by mouth daily.   More than a month at Unknown time  . Prenatal Vit-Fe Fumarate-FA (PRENATAL MULTIVITAMIN) TABS  tablet Take 1 tablet by mouth daily at 12 noon.      Review of Systems  All systems reviewed and negative except as stated in HPI  Physical Exam Blood pressure 139/63, pulse 76, temperature 98.1 F (36.7 C), resp. rate 15, height 5\' 3"  (1.6 m), last menstrual period 10/23/2019, SpO2 100 %. General appearance: alert, oriented, NAD Lungs: normal respiratory effort Heart: regular rate Abdomen: soft, non-tender; gravid, FH appropriate for GA Extremities: No calf swelling or tenderness Presentation: cephalic by leopolds Fetal monitoring: 145 bpm, moderate, 15 x15 acels no decels Uterine activity: 4 mins Dilation: 3 Effacement (%): 50 Station: -3 Exam by:: Holly Flippin RN   Prenatal labs: ABO, Rh: --/--/B POS (11/10 1526) Antibody: NEG (11/10 1526) Rubella:  Immune 03/11/20 RPR:  NR 03/11/20, 05/06/20 HBsAg:  NR 03/11/20 HIV:  NR 03/11/20, 05/06/20 GC/Chlamydia:  Positive 03/11/20, TOC Negative 04/08/20, Negative 06/03/20, 07/01/20 GBS:  Negative 07/01/20 GTT: FBS (82), 1 hr (143, 151), 2 hr (120), 3 hr (134) Genetic screening:  Negative Anatomy 07/03/20: Wnl, EFW 89.8 % (3656g), Anterior placenta 07/12/20  Prenatal Transfer Tool  Maternal Diabetes: No Genetic Screening: Normal Maternal Ultrasounds/Referrals: Normal Fetal Ultrasounds or other Referrals:  None Maternal Substance Abuse:  No Significant Maternal Medications:  None Significant Maternal Lab Results: Group B Strep negative  Results for orders placed or performed during the hospital encounter of 07/28/20 (from the past 24 hour(s))  CBC   Collection Time: 07/28/20  3:26 PM  Result Value Ref Range   WBC 7.6 4.0 - 10.5 K/uL   RBC 3.71 (L) 3.87 - 5.11 MIL/uL   Hemoglobin 11.5 (L) 12.0 - 15.0 g/dL   HCT 13/10/21 (L) 36 - 46 %   MCV 91.4 80.0 - 100.0 fL   MCH 31.0 26.0 - 34.0 pg   MCHC 33.9 30.0 - 36.0 g/dL   RDW 70.2 63.7 - 85.8 %   Platelets 140 (L) 150 - 400 K/uL   nRBC 0.0 0.0 - 0.2 %  Comprehensive metabolic panel   Collection Time: 07/28/20  3:26 PM  Result Value Ref Range   Sodium 139 135 - 145 mmol/L   Potassium 3.8 3.5 - 5.1 mmol/L   Chloride 108 98 - 111 mmol/L   CO2 22 22 - 32 mmol/L   Glucose, Bld 100 (H) 70 - 99 mg/dL   BUN 10 6 - 20 mg/dL   Creatinine, Ser 13/10/21 0.44 - 1.00 mg/dL   Calcium 8.8 (L) 8.9 - 10.3 mg/dL   Total Protein 6.1 (L) 6.5 - 8.1 g/dL   Albumin 2.8 (L) 3.5 - 5.0 g/dL   AST 20 15 - 41 U/L   ALT 17 0 - 44 U/L   Alkaline Phosphatase 137 (H) 38 - 126 U/L   Total Bilirubin 0.3 0.3 - 1.2 mg/dL   GFR, Estimated 2.77 >41 mL/min   Anion gap 9 5 - 15  Type and screen [facility]   Collection Time: 07/28/20  3:26 PM  Result Value Ref Range   ABO/RH(D) B POS    Antibody Screen NEG    Sample Expiration      07/31/2020,2359 Performed at [facility], [address] [place], [place]   Protein / creatinine ratio, urine    Collection Time: 07/28/20  4:46 PM  Result Value Ref Range   Creatinine, Urine 124.07 mg/dL   Total Protein, Urine 21 mg/dL   Protein Creatinine Ratio 0.17 (H) 0.00 - 0.15 mg/mg[Cre]  Respiratory Panel  by RT PCR (Flu A&B, Covid) - Nasopharyngeal Swab   Collection Time: 07/28/20  7:02 PM   Specimen: Nasopharyngeal Swab  Result Value Ref Range   SARS Coronavirus 2 by RT PCR NEGATIVE NEGATIVE   Influenza A by PCR NEGATIVE NEGATIVE   Influenza B by PCR NEGATIVE NEGATIVE    Patient Active Problem List   Diagnosis Date Noted  . Gestational hypertension 07/28/2020  . Gestational thrombocytopenia (HCC) 07/28/2020    Assessment: Wanda Palmer is a 24 y.o. G2P1001 at [redacted]w[redacted]d here for IOL 2/2 gHTN.   gHTN BP wnl. Denies headache/upper abdominal pain. P/C 0.17. Plt 140.  -Continue monitor BP   #Labor: Pit (2106) #Pain: Maternally supported #FWB: Cat I #ID: GBS negative #MOF: Br/Bo #MOC: None #Circ: Declined  Wanda Palmer 07/28/2020, 10:14 PM   CNM attestation:  I have seen and examined this patient; I agree with above documentation in the resident's note.   Wanda Palmer is a 24 y.o. G2P1001 here initially for eval of dizziness/panic attack, but then had an elevated BP while in MAU and platelets of 140, so she was admitted ultimately for IOL due to gHTN (had a BP value of 141/116 when her arm was bent).  PE: BP 125/72   Pulse 84   Temp 98.6 F (37 C) (Oral)   Resp 18   Ht 5\' 3"  (1.6 m)   Wt 89.8 kg   LMP 10/23/2019   SpO2 100%   BMI 35.07 kg/m  Gen: calm comfortable, NAD Resp: normal effort, no distress Abd: gravid  ROS, labs, PMH reviewed  Plan: Admit to Labor and Delivery Pitocin started as cx was favorable to start Continue to monitor BPs Anticipate vag del  12/21/2019 CNM 07/29/2020, 12:21 AM

## 2020-07-29 ENCOUNTER — Encounter (HOSPITAL_COMMUNITY): Payer: Self-pay | Admitting: Family Medicine

## 2020-07-29 ENCOUNTER — Inpatient Hospital Stay (HOSPITAL_COMMUNITY): Payer: Self-pay | Admitting: Anesthesiology

## 2020-07-29 DIAGNOSIS — O134 Gestational [pregnancy-induced] hypertension without significant proteinuria, complicating childbirth: Secondary | ICD-10-CM

## 2020-07-29 DIAGNOSIS — Z3A39 39 weeks gestation of pregnancy: Secondary | ICD-10-CM

## 2020-07-29 LAB — CBC WITH DIFFERENTIAL/PLATELET
Abs Immature Granulocytes: 0.02 10*3/uL (ref 0.00–0.07)
Basophils Absolute: 0 10*3/uL (ref 0.0–0.1)
Basophils Relative: 0 %
Eosinophils Absolute: 0.1 10*3/uL (ref 0.0–0.5)
Eosinophils Relative: 2 %
HCT: 33.6 % — ABNORMAL LOW (ref 36.0–46.0)
Hemoglobin: 11.7 g/dL — ABNORMAL LOW (ref 12.0–15.0)
Immature Granulocytes: 0 %
Lymphocytes Relative: 33 %
Lymphs Abs: 2.5 10*3/uL (ref 0.7–4.0)
MCH: 31.7 pg (ref 26.0–34.0)
MCHC: 34.8 g/dL (ref 30.0–36.0)
MCV: 91.1 fL (ref 80.0–100.0)
Monocytes Absolute: 0.4 10*3/uL (ref 0.1–1.0)
Monocytes Relative: 5 %
Neutro Abs: 4.6 10*3/uL (ref 1.7–7.7)
Neutrophils Relative %: 60 %
Platelets: 137 10*3/uL — ABNORMAL LOW (ref 150–400)
RBC: 3.69 MIL/uL — ABNORMAL LOW (ref 3.87–5.11)
RDW: 13 % (ref 11.5–15.5)
WBC: 7.7 10*3/uL (ref 4.0–10.5)
nRBC: 0 % (ref 0.0–0.2)

## 2020-07-29 LAB — RPR: RPR Ser Ql: NONREACTIVE

## 2020-07-29 MED ORDER — ACETAMINOPHEN 325 MG PO TABS: 650.0000 mg | ORAL_TABLET | ORAL | Status: DC | PRN

## 2020-07-29 MED ORDER — TETANUS-DIPHTH-ACELL PERTUSSIS 5-2.5-18.5 LF-MCG/0.5 IM SUSY: 0.5000 mL | PREFILLED_SYRINGE | Freq: Once | INTRAMUSCULAR | Status: DC

## 2020-07-29 MED ORDER — WITCH HAZEL-GLYCERIN EX PADS: 1.0000 "application " | MEDICATED_PAD | CUTANEOUS | Status: DC | PRN

## 2020-07-29 MED ORDER — SIMETHICONE 80 MG PO CHEW: 80.0000 mg | CHEWABLE_TABLET | ORAL | Status: DC | PRN

## 2020-07-29 MED ORDER — DIPHENHYDRAMINE HCL 50 MG/ML IJ SOLN: 12.5000 mg | INTRAMUSCULAR | Status: DC | PRN

## 2020-07-29 MED ORDER — COCONUT OIL OIL
1.0000 "application " | TOPICAL_OIL | Status: DC | PRN
  Administered 2020-07-29: 1 via TOPICAL

## 2020-07-29 MED ORDER — LACTATED RINGERS IV SOLN
500.0000 mL | Freq: Once | INTRAVENOUS | Status: AC
  Administered 2020-07-29: 500 mL via INTRAVENOUS

## 2020-07-29 MED ORDER — EPHEDRINE 5 MG/ML INJ
10.0000 mg | INTRAVENOUS | Status: DC | PRN
  Filled 2020-07-29: qty 2

## 2020-07-29 MED ORDER — ONDANSETRON HCL 4 MG PO TABS: 4.0000 mg | ORAL_TABLET | ORAL | Status: DC | PRN

## 2020-07-29 MED ORDER — SODIUM CHLORIDE (PF) 0.9 % IJ SOLN
INTRAMUSCULAR | Status: DC | PRN
  Administered 2020-07-29: 12 mL/h via EPIDURAL

## 2020-07-29 MED ORDER — SENNOSIDES-DOCUSATE SODIUM 8.6-50 MG PO TABS
2.0000 | ORAL_TABLET | ORAL | Status: DC
  Administered 2020-07-29 – 2020-07-30 (×2): 2 via ORAL
  Filled 2020-07-29 (×2): qty 2

## 2020-07-29 MED ORDER — FENTANYL-BUPIVACAINE-NACL 0.5-0.125-0.9 MG/250ML-% EP SOLN
12.0000 mL/h | EPIDURAL | Status: DC | PRN
  Filled 2020-07-29: qty 250

## 2020-07-29 MED ORDER — DIPHENHYDRAMINE HCL 25 MG PO CAPS: 25.0000 mg | ORAL_CAPSULE | Freq: Four times a day (QID) | ORAL | Status: DC | PRN

## 2020-07-29 MED ORDER — LACTATED RINGERS AMNIOINFUSION: INTRAVENOUS | Status: DC

## 2020-07-29 MED ORDER — PHENYLEPHRINE 40 MCG/ML (10ML) SYRINGE FOR IV PUSH (FOR BLOOD PRESSURE SUPPORT)
80.0000 ug | PREFILLED_SYRINGE | INTRAVENOUS | Status: AC | PRN
  Administered 2020-07-29 (×3): 80 ug via INTRAVENOUS

## 2020-07-29 MED ORDER — PHENYLEPHRINE 40 MCG/ML (10ML) SYRINGE FOR IV PUSH (FOR BLOOD PRESSURE SUPPORT)
80.0000 ug | PREFILLED_SYRINGE | INTRAVENOUS | Status: DC | PRN
  Filled 2020-07-29 (×3): qty 10

## 2020-07-29 MED ORDER — IBUPROFEN 600 MG PO TABS
600.0000 mg | ORAL_TABLET | Freq: Four times a day (QID) | ORAL | Status: DC
  Administered 2020-07-29 – 2020-07-31 (×7): 600 mg via ORAL
  Filled 2020-07-29 (×7): qty 1

## 2020-07-29 MED ORDER — PRENATAL MULTIVITAMIN CH
1.0000 | ORAL_TABLET | Freq: Every day | ORAL | Status: DC
  Administered 2020-07-30 – 2020-07-31 (×2): 1 via ORAL
  Filled 2020-07-29 (×2): qty 1

## 2020-07-29 MED ORDER — ONDANSETRON HCL 4 MG/2ML IJ SOLN: 4.0000 mg | INTRAMUSCULAR | Status: DC | PRN

## 2020-07-29 MED ORDER — DOCUSATE SODIUM 100 MG PO CAPS
100.0000 mg | ORAL_CAPSULE | Freq: Two times a day (BID) | ORAL | Status: DC
  Administered 2020-07-30 – 2020-07-31 (×3): 100 mg via ORAL
  Filled 2020-07-29 (×3): qty 1

## 2020-07-29 MED ORDER — LIDOCAINE-EPINEPHRINE (PF) 2 %-1:200000 IJ SOLN
INTRAMUSCULAR | Status: DC | PRN
  Administered 2020-07-29: 5 mL via EPIDURAL

## 2020-07-29 MED ORDER — BENZOCAINE-MENTHOL 20-0.5 % EX AERO
1.0000 "application " | INHALATION_SPRAY | CUTANEOUS | Status: DC | PRN
  Administered 2020-07-29: 1 via TOPICAL
  Filled 2020-07-29: qty 56

## 2020-07-29 MED ORDER — DIBUCAINE (PERIANAL) 1 % EX OINT: 1.0000 "application " | TOPICAL_OINTMENT | CUTANEOUS | Status: DC | PRN

## 2020-07-29 NOTE — Progress Notes (Signed)
LABOR PROGRESS NOTE  Assata Juncaj is a 24 y.o. G2P1001 at [redacted]w[redacted]d  admitted for IOL 2/2 gHTN.   Subjective: Sitting on side of bed endorsing discomfort with contractions. Would like an epidural at this time.    Objective: BP 121/70   Pulse 71   Temp 98.2 F (36.8 C) (Oral)   Resp 18   Ht 5\' 3"  (1.6 m)   Wt 89.8 kg   LMP 10/23/2019   SpO2 100%   BMI 35.07 kg/m  or  Vitals:   07/29/20 0131 07/29/20 0201 07/29/20 0230 07/29/20 0301  BP: (!) 103/45 (!) 100/52 (!) 116/51 121/70  Pulse: 61 74 62 71  Resp:   18 18  Temp:   98.2 F (36.8 C)   TempSrc:   Oral   SpO2:      Weight:      Height:       Dilation: 4 Effacement (%): 50 Cervical Position: Middle Station: -2 Presentation: Vertex Exam by:: Autry-Lott FHT: baseline rate 135 bpm, moderate varibility, 15 x 15 acel, no decel Toco: 1-3 mins  Labs: Lab Results  Component Value Date   WBC 7.6 07/28/2020   HGB 11.5 (L) 07/28/2020   HCT 33.9 (L) 07/28/2020   MCV 91.4 07/28/2020   PLT 140 (L) 07/28/2020    Patient Active Problem List   Diagnosis Date Noted  . Gestational hypertension 07/28/2020  . Gestational thrombocytopenia (HCC) 07/28/2020    Assessment / Plan: 24 y.o. G2P1001 at [redacted]w[redacted]d here for IOL 2/2 gHTN  gHTN BP wnl -Continue to monitor  Labor: Pit 10 mu/min, consider AROM with next cervical exam Fetal Wellbeing:  Cat I Pain Control:  Planning for epidural Anticipated MOD: Vaginal  Keitha Kolk Autry-Lott, DO 07/29/2020, 4:16 AM PGY-2, [facility]

## 2020-07-29 NOTE — Progress Notes (Addendum)
OB/GYN Faculty Practice Delivery Note  Wanda Palmer is a 24 y.o. G2P1001 s/p IOL at [redacted]w[redacted]d for gHTN.   ROM: 8h 9m with 100cc fluid GBS Status: negative  Maximum Maternal Temperature: 98.18F  Labor Progress: S/p AROM @ 720, pitocin restarted 0930 and IUPC placed around 11:00 due to difficulty titrating pitocin. Amnioinfusion started at 1230 due to variable deceleration noted on NST.   Delivery Date/Time: 07/29/2020 4:20 pm  Delivery: Called to room and patient was complete and pushing. Head delivered first. Nuchal cord present around the baby's neck. Shoulder and body delivered in usual fashion. Infant with spontaneous cry, placed on mother's abdomen, dried and stimulated. Cord clamped x 2 after 1-minute delay, and cut by provider. Cord blood drawn. Placenta delivered spontaneously with gentle cord traction. Fundus firm with massage and Pitocin. Labia, perineum, vagina, and cervix inspected with small peri-clitoral laceration noted, repaired with 3-0 suture.   Placenta: Sent to L&D Complications: None Lacerations: peri-clitoral  EBL: 100 cc Analgesia: Epidural   Postpartum Planning [x]  message to sent to schedule follow-up   Infant: APGARs 6,9   Sul gi Kim  Medical Student  07/29/2020, 4:48 PM   I was present and gloved for the entirety of the delivery. I agree with the findings and the plan of care as documented in the medical student's note.  13/07/2020, MD [facility] Family [facility] Fellow, [facility] for [facility], [facility]

## 2020-07-29 NOTE — Anesthesia Procedure Notes (Signed)
Epidural Patient location during procedure: OB Start time: 07/29/2020 4:55 AM End time: 07/29/2020 5:05 AM  Staffing Anesthesiologist: Elmer Picker, MD Performed: anesthesiologist   Preanesthetic Checklist Completed: patient identified, IV checked, risks and benefits discussed, monitors and equipment checked, pre-op evaluation and timeout performed  Epidural Patient position: sitting Prep: DuraPrep and site prepped and draped Patient monitoring: continuous pulse ox, blood pressure, heart rate and cardiac monitor Approach: midline Location: L3-L4 Injection technique: LOR air  Needle:  Needle type: Tuohy  Needle gauge: 17 G Needle length: 9 cm Needle insertion depth: 6 cm Catheter type: closed end flexible Catheter size: 19 Gauge Catheter at skin depth: 12 cm Test dose: negative  Assessment Sensory level: T8 Events: blood not aspirated, injection not painful, no injection resistance, no paresthesia and negative IV test  Additional Notes Patient identified. Risks/Benefits/Options discussed with patient including but not limited to bleeding, infection, nerve damage, paralysis, failed block, incomplete pain control, headache, blood pressure changes, nausea, vomiting, reactions to medication both or allergic, itching and postpartum back pain. Confirmed with bedside nurse the patient's most recent platelet count. Confirmed with patient that they are not currently taking any anticoagulation, have any bleeding history or any family history of bleeding disorders. Patient expressed understanding and wished to proceed. All questions were answered. Sterile technique was used throughout the entire procedure. Please see nursing notes for vital signs. Test dose was given through epidural catheter and negative prior to continuing to dose epidural or start infusion. Warning signs of high block given to the patient including shortness of breath, tingling/numbness in hands, complete motor block,  or any concerning symptoms with instructions to call for help. Patient was given instructions on fall risk and not to get out of bed. All questions and concerns addressed with instructions to call with any issues or inadequate analgesia.  Reason for block:procedure for pain

## 2020-07-29 NOTE — Discharge Summary (Signed)
Spanish interpreter used    Postpartum Discharge Summary  Date of Service updated 07/31/20     Patient Name: Wanda Palmer DOB: 28-Dec-1995 MRN: [id number]  Date of admission: 07/28/2020 Delivery date:07/29/2020  Delivering provider: Janet Berlin  Date of discharge: 07/31/2020  Admitting diagnosis: Gestational hypertension [O13.9] Intrauterine pregnancy: [redacted]w[redacted]d    Secondary diagnosis:  Active Problems:   Gestational hypertension   [place]ocytopenia [place]   Vaginal delivery  Additional problems: none    Discharge diagnosis: Term Pregnancy Delivered and Gestational Hypertension                                              Post partum procedures:none Augmentation: AROM and Pitocin  Complications: None  Hospital course: Induction of Labor With Vaginal Delivery   24y.o. yo G2P1001 at 469w0das admitted to the hospital 07/28/2020 for induction of labor.  Indication for induction: Gestational hypertension.  Patient had an uncomplicated labor course as follows: Membrane Rupture Time/Date: 7:20 AM ,07/29/2020   Delivery Method:Vaginal, Spontaneous  Episiotomy: None  Lacerations:    Details of delivery can be found in separate delivery note.  Patient had a routine postpartum course. Patient is discharged home 07/31/20.  Newborn Data: Birth date:07/29/2020  Birth time:4:07 PM  Gender:Female  Living status:Living  Apgars:6 ,9  Weight:3725 g   Magnesium Sulfate received: No BMZ received: No Rhophylac:N/A MMR:N/A T-DaP:Given postpartum, RN instructed to administer Flu: No Transfusion:No  Physical exam  Vitals:   07/30/20 0920 07/30/20 1802 07/30/20 2141 07/31/20 0633  BP: 113/74 127/66 (!) 113/50 (!) 114/58  Pulse: 81 89 66 64  Resp: _0 Temp: 97.6 F (36.4 C) 98.3 F (36.8 C) 97.9 F (36.6 C) 97.8 F (36.6 C)  TempSrc: Oral Oral Oral   SpO2: 99% 100% 100%   Weight:      Height:       General: alert, cooperative and no  distress Lochia: appropriate Uterine Fundus: firm Incision: N/A DVT Evaluation: No evidence of DVT seen on physical exam. Labs: Lab Results  Component Value Date   WBC 7.7 07/29/2020   HGB 11.7 (L) 07/29/2020   HCT 33.6 (L) 07/29/2020   MCV 91.1 07/29/2020   PLT 137 (L) 07/29/2020   CMP Latest Ref Rng & Units 07/28/2020  Glucose 70 - 99 mg/dL 100(H)  BUN 6 - 20 mg/dL 10  Creatinine 0.44 - 1.00 mg/dL 0.58  Sodium 135 - 145 mmol/L 139  Potassium 3.5 - 5.1 mmol/L 3.8  Chloride 98 - 111 mmol/L 108  CO2 22 - 32 mmol/L 22  Calcium 8.9 - 10.3 mg/dL 8.8(L)  Total Protein 6.5 - 8.1 g/dL 6.1(L)  Total Bilirubin 0.3 - 1.2 mg/dL 0.3  Alkaline Phos 38 - 126 U/L 137(H)  AST 15 - 41 U/L 20  ALT 0 - 44 U/L 17   Edinburgh Score: Edinburgh Postnatal Depression Scale Screening Tool 07/30/2020  I have been able to laugh and see the funny side of things. 0  I have looked forward with enjoyment to things. 0  I have blamed myself unnecessarily when things went wrong. 1  I have been anxious or worried for no good reason. 0  I have felt scared or panicky for no good reason. 0  Things have been getting on top of me. 1  I have been so unhappy that I  have had difficulty sleeping. 0  I have felt sad or miserable. 0  I have been so unhappy that I have been crying. 0  The thought of harming myself has occurred to me. 0  Edinburgh Postnatal Depression Scale Total 2     After visit meds:  Allergies as of 07/31/2020      Reactions   Pork-derived Products       Medication List    STOP taking these medications   aspirin 81 MG chewable tablet   prenatal multivitamin Tabs tablet     TAKE these medications   acetaminophen 325 MG tablet Commonly known as: Tylenol Take 2 tablets (650 mg total) by mouth every 4 (four) hours as needed (for pain scale < 4). What changed:   when to take this  reasons to take this   coconut oil Oil Apply 1 application topically as needed.   ibuprofen 600 MG  tablet Commonly known as: ADVIL Take 1 tablet (600 mg total) by mouth every 6 (six) hours.   PRENATAL VITAMIN PO Take by mouth.        Discharge home in stable condition Infant Feeding: Bottle Infant Disposition:home with mother Discharge instruction: per After Visit Summary and Postpartum booklet. Activity: Advance as tolerated. Pelvic rest for 6 weeks.  Diet: routine diet Future Appointments:No future appointments. Follow up Visit: Message sent to [facility] 07/31/20 for 1 week BP check, patient instructed to follow up in 4-6 weeks at [facility].  Please schedule this patient for a In person postpartum visit in 6 weeks with the following provider: Any provider. Additional Postpartum F/U:BP check 1 week  Low risk pregnancy complicated by: HTN Delivery mode:  Vaginal, Spontaneous  Anticipated Birth Control:  declines   [id number] Arrie Senate, MD

## 2020-07-29 NOTE — Progress Notes (Signed)
LABOR PROGRESS NOTE  Wanda Palmer is a 24 y.o. G2P1001 at [redacted]w[redacted]d  admitted for IOL 2/2 gHTN.   Subjective: Doing well resting comfortably.   Objective: BP (!) 113/57    Pulse (!) 116    Temp 98.1 F (36.7 C) (Oral)    Resp 18    Ht 5\' 3"  (1.6 m)    Wt 89.8 kg    LMP 10/23/2019    SpO2 99%    BMI 35.07 kg/m  or  Vitals:   07/29/20 0601 07/29/20 0700 07/29/20 0707 07/29/20 0718  BP: 117/62 (!) 91/49 (!) 111/51 (!) 113/57  Pulse: 75 72 71 (!) 116  Resp:      Temp:      TempSrc:      SpO2:      Weight:      Height:       Dilation: 6 Effacement (%): 70 Cervical Position: Middle Station: -1 Presentation: Vertex Exam by:: Autry-Lott FHT: baseline rate 130 bpm, moderate varibility, 15 x 15 acel, late decel Toco: 3-4 mins  Labs: Lab Results  Component Value Date   WBC 7.7 07/29/2020   HGB 11.7 (L) 07/29/2020   HCT 33.6 (L) 07/29/2020   MCV 91.1 07/29/2020   PLT 137 (L) 07/29/2020   Patient Active Problem List   Diagnosis Date Noted   Gestational hypertension 07/28/2020   Gestational thrombocytopenia (HCC) 07/28/2020   Assessment / Plan: 24 y.o. G2P1001 at [redacted]w[redacted]d here for IOL 2/2 gHTN  Labor: s/p Pit, AROM (0720) Fetal Wellbeing: Cat II, position change s/p 500 cc bolus x2/phenylephrine  Pain Control:  Epidural Anticipated MOD: Vaginal  Klinton Candelas Autry-Lott, DO 07/29/2020, 7:31 AM PGY-2, [facility]

## 2020-07-29 NOTE — Lactation Note (Signed)
This note was copied from a baby's chart. Lactation Consultation Note  Patient Name: Wanda Palmer [id number] Date: 07/29/2020 Reason for consult: Initial assessment  Initial visit to 4 hours old of a P2 mother with breastfeeding experience. Infant is in mother's arms upon arrival. Mother states infant latched very well after delivery. Talked to mother about hand expression and demonstrated technique. Colostrum easily expressed and collected a few drops.  Infant is sleepy and uninterested. Discussed normal newborn behavior during the first days of life. Reviewed [place] book[place] and provided Lactation services brochure.   Plan: 1-Breastfeeding on demand, ensuring a deep, comfortable latch.  2-Offer breast 8-12 times in 24h period to establish good milk supply. 3-Undressing infant and place skin to skin when ready to breastfeed 4-Keep infant awake during breastfeeding session: massaging breast, infant's hand/shoulder/feet 5-Monitor voids and stools as signs good intake.  6-Encouraged maternal rest, hydration and food intake.  7-Contact LC as needed for feeds/support/concerns/questions   All questions answered at this time. .   Maternal Data Formula Feeding for Exclusion: Yes Reason for exclusion: Mother's choice to formula and breast feed on admission Has patient been taught Hand Expression?: Yes Does the patient have breastfeeding experience prior to this delivery?: Yes  Interventions Interventions: Breast feeding basics reviewed;Expressed milk;Hand express;Breast massage  Lactation Tools Discussed/Used WIC Program: Yes   Consult Status Consult Status: Follow-up Date: 07/30/20 Follow-up type: In-patient    Mariaisabel Bodiford A Higuera Ancidey 07/29/2020, 8:56 PM

## 2020-07-29 NOTE — Progress Notes (Signed)
Labor Progress Note Wanda Palmer is a 24 y.o. G2P1001 at [redacted]w[redacted]d presented for IOL for gHTN S: Doing okay overall   O:  BP 122/70   Pulse 92   Temp 98.2 F (36.8 C) (Axillary)   Resp 18   Ht 5\' 3"  (1.6 m)   Wt 89.8 kg   LMP 10/23/2019   SpO2 99%   BMI 35.07 kg/m  EFM: 130/mod/accels present/ intermittent late and variable decels   CVE: Dilation: 9 Effacement (%): 100 Cervical Position: Middle Station: Plus 1 Presentation: Vertex Exam by:: [email or web address] RN   A&P: 24 y.o. G2P1001 [redacted]w[redacted]d here for IOL for gHTN   #Induction of Labor: Pitocin started overnight, paused due to late decelerations. s/p AROM @0720 , pitocin restarted 0930 and IUPC placed around 11:00 due to difficulty titrating pitocin. Amnioinfusion started at 1230. Pitocin currently running at 6. Patient has now progressed to 9cm. Anticipate SVD.    #ghtn: asymptomatic, Bps non hypertensive #Pain: Epidural #FWB: Cat 2 due to intermittent late and variable decels, reassuring overall with moderate variability  #GBS negative   [redacted]w[redacted]d, MD 2:10 PM

## 2020-07-29 NOTE — Progress Notes (Signed)
[employer] spanish interpreter used throughout entirety of assessment.  Midwife reviewed plan of care with patient and support person.  All questions answered at this time.  RN to order clear liquid diet for patient at this time.

## 2020-07-29 NOTE — Anesthesia Preprocedure Evaluation (Addendum)
Anesthesia Evaluation  Patient identified by MRN, date of birth, ID band Patient awake    Reviewed: Allergy & Precautions, NPO status , Patient's Chart, lab work & pertinent test results  Airway Mallampati: II  TM Distance: >3 FB Neck ROM: Full    Dental no notable dental hx.    Pulmonary neg pulmonary ROS,    Pulmonary exam normal breath sounds clear to auscultation       Cardiovascular hypertension, Normal cardiovascular exam Rhythm:Regular Rate:Normal     Neuro/Psych Seizures -, Well Controlled,  negative psych ROS   GI/Hepatic negative GI ROS, Neg liver ROS,   Endo/Other  negative endocrine ROS  Renal/GU negative Renal ROS  negative genitourinary   Musculoskeletal negative musculoskeletal ROS (+)   Abdominal   Peds  Hematology negative hematology ROS (+)   Anesthesia Other Findings IOL for gHTN  Reproductive/Obstetrics (+) Pregnancy                            Anesthesia Physical Anesthesia Plan  ASA: III  Anesthesia Plan: Epidural   Post-op Pain Management:    Induction:   PONV Risk Score and Plan: Treatment may vary due to age or medical condition  Airway Management Planned: Natural Airway  Additional Equipment:   Intra-op Plan:   Post-operative Plan:   Informed Consent: I have reviewed the patients History and Physical, chart, labs and discussed the procedure including the risks, benefits and alternatives for the proposed anesthesia with the patient or authorized representative who has indicated his/her understanding and acceptance.       Plan Discussed with: Anesthesiologist  Anesthesia Plan Comments: (Patient identified. Risks, benefits, options discussed with patient including but not limited to bleeding, infection, nerve damage, paralysis, failed block, incomplete pain control, headache, blood pressure changes, nausea, vomiting, reactions to medication, itching,  and post partum back pain. Confirmed with bedside nurse the patient's most recent platelet count. Confirmed with the patient that they are not taking any anticoagulation, have any bleeding history or any family history of bleeding disorders. Patient expressed understanding and wishes to proceed. All questions were answered. )        Anesthesia Quick Evaluation

## 2020-07-29 NOTE — Progress Notes (Signed)
In house spanish interpreter at bedside throughout assessment.  Reviewed plan of care with patient and support person.  All questions answered at this time.

## 2020-07-29 NOTE — Progress Notes (Signed)
Labor Progress Note Wanda Palmer is a 24 y.o. G2P1001 at [redacted]w[redacted]d presented for IOL for gHTN S: Feeling well no concerns  O:  BP (!) 120/56   Pulse 76   Temp 98.6 F (37 C) (Axillary)   Resp 18   Ht 5\' 3"  (1.6 m)   Wt 89.8 kg   LMP 10/23/2019   SpO2 99%   BMI 35.07 kg/m  EFM: 125/mod/accels present/ reccurent late decels  CVE: Dilation: 6 Effacement (%): 80 Cervical Position: Middle Station: -1 Presentation: Vertex Exam by:: Dr. [email or web address]   A&P: 24 y.o. G2P1001 [redacted]w[redacted]d  #Labor: s/p AROM @0720  and IUPC placed around 11:00. On and off pit, most tolerated was 10. Continue to try to titrate pit given inadequate contractions. #ghtn: asymptomatic, Bps non hypertensive #Pain: Epidural #FWB: Cat 2 with reccurent late decels which are improving s/p fluid resusictation. #GBS negative   [redacted]w[redacted]d, MD 11:21 AM

## 2020-07-29 NOTE — Progress Notes (Signed)
LABOR PROGRESS NOTE  Wanda Palmer is a 24 y.o. G2P1001 at [redacted]w[redacted]d  admitted for IOL  Subjective: Doing well resting comfortably.  Objective: BP 117/62   Pulse 75   Temp 98.1 F (36.7 C) (Oral)   Resp 18   Ht 5\' 3"  (1.6 m)   Wt 89.8 kg   LMP 10/23/2019   SpO2 99%   BMI 35.07 kg/m  or  Vitals:   07/29/20 0530 07/29/20 0531 07/29/20 0543 07/29/20 0601  BP:  110/60 (!) 114/55 117/62  Pulse:  71 (!) 103 75  Resp:   18   Temp:   98.1 F (36.7 C)   TempSrc:   Oral   SpO2: 99% 99%    Weight:      Height:       Dilation: 5.5 Effacement (%): 70 Cervical Position: Middle Station: -1 Presentation: Vertex Exam by:: Autry-Lott FHT: baseline rate 135 bpm, moderate varibility, 10 x 10 acel, late to variable decel Toco: minimal   Labs: Lab Results  Component Value Date   WBC 7.7 07/29/2020   HGB 11.7 (L) 07/29/2020   HCT 33.6 (L) 07/29/2020   MCV 91.1 07/29/2020   PLT 137 (L) 07/29/2020    Patient Active Problem List   Diagnosis Date Noted  . Gestational hypertension 07/28/2020  . Gestational thrombocytopenia (HCC) 07/28/2020    Assessment / Plan: 24 y.o. G2P1001 at [redacted]w[redacted]d here for IOL 2/2 gHTN  gHTN BP soft -Continue to monitor  Labor: Pit, consider AROM when appropriate Fetal Wellbeing: Cat II s/p 500 cc bolus x1, will redose Pain Control:  Epidural Anticipated MOD: Vaginal  Jaamal Farooqui Autry-Lott, DO 07/29/2020, 7:39 AM PGY-2, [facility]

## 2020-07-30 NOTE — Anesthesia Postprocedure Evaluation (Signed)
Anesthesia Post Note  Patient: Wanda Palmer  Procedure(s) Performed: AN AD HOC LABOR EPIDURAL     Patient location during evaluation: Mother Baby Anesthesia Type: Epidural Level of consciousness: awake and alert, oriented and patient cooperative Pain management: pain level controlled Vital Signs Assessment: post-procedure vital signs reviewed and stable Respiratory status: spontaneous breathing Cardiovascular status: stable Postop Assessment: no headache, epidural receding, patient able to bend at knees and no signs of nausea or vomiting Anesthetic complications: no Comments: Pt. States she is walking.  Pain score 5.  Pt. Encouraged to communicate pain needs with bedside RN.   No complications documented.  Last Vitals:  Vitals:   07/29/20 2351 07/30/20 0513  BP: 123/67 113/67  Pulse: 80 80  Resp:    Temp: 37 C 36.6 C  SpO2:      Last Pain:  Vitals:   07/30/20 0615  TempSrc:   PainSc: 2    Pain Goal:                   [facility]

## 2020-07-30 NOTE — Lactation Note (Signed)
This note was copied from a baby's chart. Lactation Consultation Note  Patient Name: Wanda Palmer [id number] Date: 07/30/2020 Reason for consult: Follow-up assessment  Follow up visit to 24 hours old infant with 3.19% weight los. Mother states breastfeeding is going well but she feels baby is not getting enough. Mother explains she continues bottle-feeding formula as a supplement. Last feeding baby breastfed for and bottlefed 15 mL of formula.    Reviewed with mother average size of a NB stomach and formula supplementation guidelines. Provided breastfeeding supplementation amounts. Discussed pace bottle feeding benefits and demonstrated technique. Encourage to follow babies' hunger and fullness cues. Reviewed importance to offer the breast 8 to 12 times in a 24-hour period for proper stimulation and to establish good milk supply. Reviewed signs of good milk transfer. Discussed milk coming to volume. Promoted maternal rest, hydration and food intake. Reviewed newborn behavior and expectations with mother and encouraged to contact [facility] for support, questions or concerns.    All questions answered at this time.   Consult was done in [place].   Maternal Data Formula Feeding for Exclusion: Yes Reason for exclusion: Mother's choice to formula and breast feed on admission Has patient been taught Hand Expression?: Yes  Feeding Feeding Type: Formula Nipple Type: Slow - flow  Interventions Interventions: Breast feeding basics reviewed  Lactation Tools Discussed/Used WIC Program: Yes   Consult Status Consult Status: Follow-up Date: 07/31/20 Follow-up type: In-patient    Syncere Kaminski A Higuera Ancidey 07/30/2020, 4:33 PM

## 2020-07-30 NOTE — Progress Notes (Signed)
Post Partum Day #1 Subjective: Patient reports doing well without any complaints. She is up and walking, eating, and drinking. Patient notes blood and pain are improving. She is breastfeeding. She does not want a circumcision and prefers to not use birth control. Patient would like to go home this evening.  Objective: Blood pressure 113/67, pulse 80, temperature 97.8 F (36.6 C), temperature source Oral, resp. rate 17, height 5\' 3"  (1.6 m), weight 89.8 kg, last menstrual period 10/23/2019, SpO2 100 %, unknown if currently breastfeeding.  Physical Exam:  General: alert, cooperative and no distress Lochia: Appropriate Uterine Fundus: Firm DVT Evaluation: No signs of DVT, no swelling, pain, discoloration.   Recent Labs    07/28/20 1526 07/29/20 0323  HGB 11.5* 11.7*  HCT 33.9* 33.6*    Assessment/Plan: Plan for discharge this evening.  BP within normal limits. Patient is breastfeeding and prefers no birth control.    LOS: 2 days   13/11/21 07/30/2020, 8:48 AM

## 2020-07-31 DIAGNOSIS — O135 Gestational [pregnancy-induced] hypertension without significant proteinuria, complicating the puerperium: Secondary | ICD-10-CM

## 2020-07-31 DIAGNOSIS — D696 Thrombocytopenia, unspecified: Secondary | ICD-10-CM

## 2020-07-31 DIAGNOSIS — O9913 Other diseases of the blood and blood-forming organs and certain disorders involving the immune mechanism complicating the puerperium: Secondary | ICD-10-CM

## 2020-07-31 MED ORDER — COCONUT OIL OIL
1.0000 | TOPICAL_OIL | 0 refills | Status: DC | PRN
Start: 2020-07-31 — End: 2021-04-19

## 2020-07-31 MED ORDER — ACETAMINOPHEN 325 MG PO TABS
650.0000 mg | ORAL_TABLET | ORAL | Status: DC | PRN
Start: 2020-07-31 — End: 2021-08-07

## 2020-07-31 MED ORDER — IBUPROFEN 600 MG PO TABS
600.0000 mg | ORAL_TABLET | Freq: Four times a day (QID) | ORAL | 0 refills | Status: DC
Start: 2020-07-31 — End: 2021-01-14

## 2020-07-31 NOTE — Lactation Note (Addendum)
This note was copied from a baby's chart. Lactation Consultation Note  Patient Name: Wanda Palmer [id number] Date: 07/31/2020 Reason for consult: Follow-up assessment  Follow up visit to 44 hours old with 4.94% weight loss of a P2 mother with breastfeeding experience. Baby is sleeping in mother's arms upon arrival. Mother states breastfeeding is going well but infant is not getting full. Infant is currently taking ~27mL of formula. Last feeding baby breastfed for 15-20 minutes. Infant has been been having good voids and stools, per mother.  Mother requests a manual pump to use at home. Provided manual pump and demonstrated use.  Feeding plan:  1. Breastfeed following hunger cues.  2. Stimulate infant awake at the breast 3. Offer breast 8 -  12 times in 24h period to establish good milk supply.   4. If needed supplement with formula following guidelines, pace bottle feeding and fullness cues.   5. Encouraged maternal rest, hydration and food intake.  6. Contact Lactation Services or local resources for support, questions or concerns.    All questions answered at this time. Family is waiting to be discharged home today.   Maternal Data Formula Feeding for Exclusion: Yes Reason for exclusion: Mother's choice to formula and breast feed on admission  Interventions Interventions: Breast feeding basics reviewed;Hand pump  Lactation Tools Discussed/Used Pump Review: Milk Storage;Setup, frequency, and cleaning Initiated by:: Gabriele Zwilling IBCLC Date initiated:: 07/31/20   Consult Status Consult Status: Complete Date: 07/31/20 Follow-up type: Call as needed    Muskaan Smet A Higuera Ancidey 07/31/2020, 12:54 PM

## 2020-07-31 NOTE — Discharge Instructions (Signed)
-blood pressure check in [facility] at [facility] will call you with this appt  -follow up in 4-6 weeks at health department -continue prenatals if you are not on birth control -take tylenol and ibuprofen for pain control  Cuidados ps-parto aps parto normal Postpartum Care After Vaginal Delivery Este folheto fornece informaes sobre como cuidar de si, desde o momento do parto at as 6-12 [employer] (perodo ps-parto). Seu mdico tambm poder fornecer instrues mais especficas. Caso tenha problemas ou perguntas, entre em contato com o seu mdico. Siga essas instrues em casa: Sangramento vaginal   normal ter sangramento vaginal (lquios) aps o parto. Use um absorvente para sangramento e secreo vaginal. ? Durante a primeira semana aps o parto, a quantidade e a aparncia dos lquios costumam ser semelhantes a um perodo menstrual. ? Nas semanas seguintes, ele diminuir gradualmente para uma secreo amarelada e seca. ? Para a [employer], os lquios cessam completamente at 4-6 semanas aps o parto. O sangramento vaginal pode variar de mulher para mulher.  Troque seus absorventes com frequncia. Fique atento a qualquer alterao no seu fluxo, como: ? Um aumento repentino no volume. ? Mudana na cor. ? Grandes cogulos sanguneos.  Se voc expelir um cogulo sanguneo pela vagina, guarde-o e ligue para seu mdico. No elimine os cogulos no vaso sanitrio sem falar com seu mdico antes.  No use absorventes internos nem faa duchas vaginais at o seu mdico dizer que  seguro faz-lo.  Caso no esteja amamentando, sua menstruao dever retornar 6-8 semanas aps o parto. Caso esteja alimentando a criana apenas no peito (aleitamento materno exclusivo), seu ciclo menstrual pode no retornar at [employer]. Cuidados com a rea perineal  Mantenha a rea entre a vagina e o nus (perneo) limpa e seca conforme orientado pelo seu mdico.  Use absorventes com medicamentos e sprays e cremes para aliviar a dor conforme orientado.  Se um corte no perneo (episiotomia) ou um rasgo na vagina tiver sido feito em voc, verifique se h sinais de infeco na rea at [employer]. Verifique a ocorrncia de: ? Aumento da vermelhido, do Film/video editor. ? Lquido ou sangue saindo do corte ou rasgo. ? Calor. ? Pus ou mau cheiro.  Voc pode receber uma garrafa de esguicho para usar em vez de passar papel higinico para limpar a rea do perneo depois de ir ao banheiro. Conforme comear a Futures trader, voc pode usar o esguicho antes de se limpar. Lembre-se de limpar suavemente.  Para aliviar a dor causada por uma episiotomia, um corte na vagina ou veias inchadas no nus (hemorroidas), tente tomar um banho de assento morno 2-3 vezes ao dia. Um banho de assento  um banho morno que voc toma enquanto est sentada. A gua deve chegar somente at o seu quadril e cobrir suas ndegas. Cuidados com os seios  Nos primeiros dias aps o parto, suas mamas podem ficar pesadas, cheias e desconfortveis (ingurgitao mamria). Tambm pode vazar leite de suas mamas. Seu mdico pode sugerir formas de Paramedic o desconforto. A ingurgitao mamria deve passar em poucos dias.  Caso esteja amamentando: ? Use um suti que oferece apoio aos seus seios e seja do tamanho adequado. ? Mantenha os mamilos limpos e secos. Aplique cremes e pomadas conforme as orientado pelo seu mdico. ? Voc pode precisar usar absorventes de mama para reter o leite que vazar de seus seios. ? Voc pode ter contraes uterinas toda vez que amamentar durante vrias semanas  aps o parto. As contraes uterinas ajudam seu tero a retornar ao tamanho normal. ? Se voc tiver algum problema com a amamentao, converse com seu mdico ou consultor de lactao.  Caso no esteja amamentando: ? Evite tocar muito nas mamas. Isso pode fazer com que seus seios produzam mais leite. ? Use um  suti bem ajustado e compressas frias para ajudar no inchao. ? No extraia leite (bombear). Isso faz voc produzir [employer]. Intimidade e sexualidade  Pergunte ao seu mdico sobre quando voc poder ter relaes sexuais. Isso poder depender de: ? Seu risco de infeces. ? Se voc est cicatrizando rpido. ? Seu conforto e desejo de Education officer, environmental atividade sexual.  Voc pode engravidar aps o parto mesmo se voc no tiver [employer]. Se desejar, converse com seu mdico sobre mtodos de controle de natalidade (contracepo). Medicamentos  Tome medicamentos vendidos com ou sem receita mdica somente de acordo com as indicaes do seu mdico.  Caso tenha recebido uma prescrio de antibitico, tome-o somente como determinado pelo seu mdico. No pare de tomar o antibitico mesmo se comear a se Passenger transport manager. Atividades  Retorne gradualmente s suas atividades normais de acordo com as orientaes do seu mdico. Pergunte ao seu mdico quais atividades so seguras para voc.  Repouse o mximo possvel. Tente descansar ou tirar uma soneca quando seu beb estiver dormindo. Alimentos e bebidas   Beba lquidos em quantidade suficiente para manter a urina na cor amarelo-claro.  Consuma alimentos com elevado teor de fibras todos os dias. Eles podero ajudar a prevenir ou aliviar a constipao. Alimentos ricos em fibras incluem: ? Cereais e pes integrais. ? Arroz integral. ? Feijes. ? Frutas e legumes frescos.  No tente perder peso rapidamente cortando calorias.  Tome suas vitaminas pr-natal at seu check-up ps-parto ou at [employer] mdico lhe dizer para parar. Estilo de vida  No use nenhum produto que contenha nicotina ou tabaco, como cigarros tradicionais e cigarros eletrnicos. Caso precise de ajuda para parar de fumar, fale com seu mdico.  No consuma lcool, especialmente se estiver amamentando. Instrues gerais  Comparea a todas as consultas de [employer] e do beb de  acordo com as orientaes do seu mdico. A maioria das mulheres passa com seu mdico para um check-up ps-parto nas primeiras 3-6 semanas aps o parto. Entre em contato com um mdico se:  Sentir-se incapaz de lidar com as mudanas que uma criana traz  sua vida, e esse sentimento no passar.  Voc se sentir incomumente triste ou preocupada.  Suas mamas ficarem vermelhas, doloridas ou duras.  Tiver febre.  Tiver dificuldade em segurar a Actor que a urina vaze.  Tiver pouco ou nenhum interesse nas atividades de que voc [place].  No tiver amamentado nenhuma vez e no menstruar por 12 semanas aps o parto.  Tiver parado de amamentar e no menstruar por 12 semanas aps parar de amamentar.  Tiver dvidas sobre como cuidar de voc mesma ou do beb.  Expelir um cogulo sanguneo pela vagina. Tomasa Hose ajuda imediatamente se:  Sentir dor no peito.  Tiver dificuldade de Industrial/product designer.  Tiver dor repentina e intensa nas pernas.  Tiver dor intensa ou clicas na parte inferior do abdome.  Sangrar muito pela vagina a ponto de ter que usar mais de um absorvente por hora. O sangramento no pode ser [address] intenso do que a menstruao mais intensa que voc costuma ter.  Sentir dor de cabea intensa.  Desmaiar.  Tiver viso embaada ou manchas na viso.  Tiver corrimento  vaginal com cheiro ruim.  Tiver pensamentos de Clinical cytogeneticist a si mesma ou ao seu beb. Se sentir vontade de ferir a si mesmo ou a terceiros ou pensar em tirar a prpria vida, procure ajuda imediatamente. Voc pode ir para o pronto-socorro mais prximo ou ligar para:  O nmero de Technical brewer (911, nos EUA).  Um servio telefnico de preveno do suicdio, como o National Suicide Prevention Lifeline, no nmero [phone]. Ele funciona 24 horas por dia. Resumo  O perodo de tempo logo aps o nascimento do recm-nascido at 6-12 semanas aps o parto  chamado de perodo ps-parto.  Retorne gradualmente s suas  atividades normais de acordo com as orientaes do seu mdico.  Comparea a todas as consultas de acompanhamento suas e do beb de acordo com as orientaes do seu mdico. Estas informaes no se destinam a substituir as recomendaes de seu mdico. No deixe de discutir quaisquer dvidas com seu mdico. Document Revised: 12/15/2017 Elsevier Patient Education  [employer].

## 2020-09-18 NOTE — L&D Delivery Note (Signed)
Delivery Note Progressed to complete dilation and pushed well  Baby was ROP and had some decels to 80s during second stage.   We turned her to left side and baby did a long-arc rotation to delivery.  At 9:56 PM a viable and healthy female was delivered via Vaginal, Spontaneous (Presentation: Right Occiput Anterior).  APGAR: 9, 9; weight  .   Placenta status: Spontaneous, Intact.  Cord: 3 vessels with the following complications: None.    Anesthesia: Epidural Episiotomy: None Lacerations: very superficial periclitoral hood laceration Suture Repair:  none Est. Blood Loss (mL): 50  Mom to postpartum.  Baby to Couplet care / Skin to Skin  Patient would like a BTL, but has no insurance, Discussed the hospital may require full payment prior to surgery, but we will need to clarify in AM with Business office.  They state they can do this and are wanting to proceed.  Will leave epidural catheter in place for now.  Wynelle Bourgeois 08/05/2021, 10:48 PM  Please schedule this patient for Postpartum visit in: 4 weeks with the following provider: Any provider In-Person For C/S patients schedule nurse incision check in weeks 2 weeks: no Low risk pregnancy complicated by:  abnormal 1 hour GTT with normal 3 hr GTT, short interval between pregnancies Delivery mode:  SVD Anticipated Birth Control:  BTL done PP PP Procedures needed: 2 hour GTT  Edinburgh: negative Schedule Integrated BH visit: no  no relevant baby issues

## 2021-01-14 ENCOUNTER — Encounter (HOSPITAL_COMMUNITY): Payer: Self-pay | Admitting: Obstetrics & Gynecology

## 2021-01-14 ENCOUNTER — Inpatient Hospital Stay (HOSPITAL_COMMUNITY)
Admission: AD | Admit: 2021-01-14 | Discharge: 2021-01-14 | Disposition: A | Discharge status: Home / Self Care | Payer: Self-pay | Attending: Obstetrics & Gynecology | Admitting: Obstetrics & Gynecology

## 2021-01-14 ENCOUNTER — Other Ambulatory Visit: Payer: Self-pay

## 2021-01-14 DIAGNOSIS — O99511 Diseases of the respiratory system complicating pregnancy, first trimester: Secondary | ICD-10-CM | POA: Insufficient documentation

## 2021-01-14 DIAGNOSIS — J069 Acute upper respiratory infection, unspecified: Secondary | ICD-10-CM

## 2021-01-14 DIAGNOSIS — Z3A11 11 weeks gestation of pregnancy: Secondary | ICD-10-CM | POA: Insufficient documentation

## 2021-01-14 DIAGNOSIS — Z20822 Contact with and (suspected) exposure to covid-19: Secondary | ICD-10-CM | POA: Insufficient documentation

## 2021-01-14 DIAGNOSIS — O26891 Other specified pregnancy related conditions, first trimester: Secondary | ICD-10-CM | POA: Insufficient documentation

## 2021-01-14 DIAGNOSIS — Z789 Other specified health status: Secondary | ICD-10-CM

## 2021-01-14 DIAGNOSIS — J029 Acute pharyngitis, unspecified: Secondary | ICD-10-CM | POA: Insufficient documentation

## 2021-01-14 LAB — RESP PANEL BY RT-PCR (FLU A&B, COVID) ARPGX2
Influenza A by PCR: NEGATIVE
Influenza B by PCR: NEGATIVE
SARS Coronavirus 2 by RT PCR: NEGATIVE

## 2021-01-14 LAB — POCT PREGNANCY, URINE: Preg Test, Ur: POSITIVE — AB

## 2021-01-14 NOTE — Discharge Instructions (Signed)
Las medicinas seguras para tomar durante el embarazo  Safe Medications in Pregnancy  Acn:  Benzoyl Peroxide (Perxido de benzolo)  Salicylic Acid (cido saliclico)  Dolor de espalda/Dolor de cabeza:  Tylenol: 2 pastillas de concentracin regular cada 4 horas O 2 pastillas de concentracin fuerte cada 6 horas  Resfriados/Tos/Alergias:  Benadryl (sin alcohol) 25 mg cada 6 horas segn lo necesite Breath Right strips (Tiras para respirar correctamente)  Claritin  Cepacol (pastillas de chupar para la garganta)  Chloraseptic (aerosol para la garganta)  Cold-Eeze- hasta tres veces por da  Cough drops (pastillas de chupar para la tos, sin alcohol)  Flonase (con receta mdica solamente)  Guaifenesin  Mucinex  Robitussin DM (simple solamente, sin alcohol)  Saline nasal spray/drops (Aerosol nasal salino/gotas) Sudafed (pseudoephedrine) y  Actifed * utilizar slo despus de 12 semanas de gestacin y si no tiene la presin arterial alta.  Tylenol Vicks  VapoRub  Zinc lozenges (pastillas para la garganta)  Zyrtec  Estreimiento:  Colace  Ducolax (supositorios)  Fleet enema (lavado intestinal rectal)  Glycerin (supositorios)  Metamucil  Milk of magnesia (leche de magnesia)  Miralax  Senokot  Smooth Move (t)  Diarrea:  Kaopectate Imodium A-D  *NO tome Pepto-Bismol  Hemorroides:  Anusol  Anusol HC  Preparation H  Tucks  Indigestin:  Tums  Maalox  Mylanta  Zantac  Pepcid  Insomnia:  Benadryl (sin alcohol) 25mg cada 6 horas segn lo necesite  Tylenol PM  Unisom, no Gelcaps  Calambres en las piernas:  Tums  MagGel Nuseas/Vmitos:  Bonine  Dramamine  Emetrol  Ginger (extracto)  Sea-Bands  Meclizine  Medicina para las nuseas que puede tomar durante el embarazo: Unisom (doxylamine succinate, pastillas de 25 mg) Tome una pastilla al da al acostarse. Si los sntomas no estn adecuadamente controlados, la dosis puede aumentarse hasta una dosis mxima recomendada de dos  pastillas al da (1/2 pastilla por la maana, 1/2 pastilla a media tarde y una pastilla al acostarse). Pastillas de Vitamina B6 de 100mg. Tome una pastilla dos veces al da (hasta 200 mg por da).  Erupciones en la piel:  Productos de Aveeno  Benadryl cream (crema o una dosis de 25mg cada 6 horas segn lo necesite)  Calamine Lotion (locin)  1% cortisone cream (crema de cortisona de 1%)  nfeccin vaginal por hongos (candidiasis):  Gyne-lotrimin 7  Monistat 7   **Si est tomando varias medicinas, por favor revise las etiquetas para evitar duplicar los mismos ingredientes activos. **Tome la medicina segn lo indicado en la etiqueta. **No tome ms de 400 mg de Tylenol en 24 horas. **No tome medicinas que contengan aspirina o ibuprofeno.   Prenatal Care Providers           [facility]  [address] [phone]  [facility]   [address]  [phone]  [facility] [place]       [address] [phone]            [facility] [place]     [address] [phone]          [facility] [place]   [address] [phone]  [facility]  [address] [phone]     [facility]  [address]   [phone]   Avenue   [phone]     [facility]  Phone: [phone]  [facility]  Phone: [phone]  [facility] Phone: [phone]  [facility] Phone: [phone]  [facility] Phone: [phone]  [facility] Phone: [phone]  [facility]  Phone: [phone]

## 2021-01-14 NOTE — MAU Provider Note (Signed)
History     CSN: [id number]  Arrival date and time: 01/14/21 1652   Event Date/Time   First Provider Initiated Contact with Patient 01/14/21 1815      Chief Complaint  Patient presents with  . Fever  . Sore Throat  . Generalized Body Aches  . Cough   Ms. Wanda Palmer is a 25 y.o. year old [id number] female at [redacted]w[redacted]d weeks gestation who presents to MAU reporting body aches for 3 days.  She reports that 2 AM she felt like she was "burning up" but did not take an actual temperature.  She also complains of a sore throat, cough; sometimes productive.  She does report having sick kids at home with both having negative COVID test.  She has taken no medications for any of her symptoms.   OB History    Gravida  3   Para  2   Term  2   Preterm  0   AB  0   Living  2     SAB  0   IAB  0   Ectopic  0   Multiple  0   Live Births  2           Past Medical History:  Diagnosis Date  . Medical history non-contributory     Past Surgical History:  Procedure Laterality Date  . NO PAST SURGERIES      History reviewed. No pertinent family history.  Social History   Tobacco Use  . Smoking status: Never Smoker  . Smokeless tobacco: Never Used  Vaping Use  . Vaping Use: Never used  Substance Use Topics  . Alcohol use: Not Currently  . Drug use: Not Currently    Allergies:  Allergies  Allergen Reactions  . Pork-Derived Products     Medications Prior to Admission  Medication Sig Dispense Refill Last Dose  . acetaminophen (TYLENOL) 325 MG tablet Take 2 tablets (650 mg total) by mouth every 4 (four) hours as needed (for pain scale < 4).   Past Month at Unknown time  . coconut oil OIL Apply 1 application topically as needed.  0 Past Month at Unknown time  . ibuprofen (ADVIL) 600 MG tablet Take 1 tablet (600 mg total) by mouth every 6 (six) hours. 30 tablet 0 Past Month at Unknown time  . Prenatal Vit-Fe Fumarate-FA (PRENATAL VITAMIN PO) Take by mouth.    01/14/2021 at Unknown time    Review of Systems  Constitutional: Positive for fever (felt like was "burning up @0200 ", did not take temperature ).  HENT: Positive for sore throat.   Eyes: Negative.   Respiratory: Positive for cough (sometimes productive).   Cardiovascular: Negative.   Gastrointestinal: Negative.   Endocrine: Negative.   Genitourinary: Negative.   Musculoskeletal: Negative.   Skin: Negative.   Allergic/Immunologic: Negative.   Neurological: Negative.   Hematological: Negative.   Psychiatric/Behavioral: Negative.    Physical Exam   Blood pressure 110/63, pulse (!) 105, temperature 97.9 F (36.6 C), temperature source Oral, resp. rate 18, weight 78.3 kg, last menstrual period 10/27/2020, SpO2 100 %, unknown if currently breastfeeding.  Physical Exam Vitals and nursing note reviewed.  Constitutional:      Appearance: Normal appearance. She is obese.  HENT:     Mouth/Throat:     Mouth: Mucous membranes are moist.  Cardiovascular:     Rate and Rhythm: Normal rate.     Pulses: Normal pulses.     Heart sounds: Normal heart sounds.  Pulmonary:     Effort: Pulmonary effort is normal.     Breath sounds: Normal breath sounds.  Abdominal:     General: Bowel sounds are normal.     Palpations: Abdomen is soft.  Genitourinary:    Comments: Not indicated Musculoskeletal:        General: Normal range of motion.     Cervical back: Normal range of motion and neck supple.  Skin:    General: Skin is warm and dry.  Neurological:     Mental Status: She is alert and oriented to person, place, and time.  Psychiatric:        Mood and Affect: Mood normal.        Behavior: Behavior normal.        Thought Content: Thought content normal.        Judgment: Judgment normal.    Unable to obtain FHTs by doppler x 2 attempts MAU Course  Procedures  MDM UPT COVID/Flu Swab  Results for orders placed or performed during the hospital encounter of 01/14/21 (from the past 72  hour(s))  Pregnancy, urine POC     Status: Abnormal   Collection Time: 01/14/21  5:24 PM  Result Value Ref Range   Preg Test, Ur POSITIVE (A) NEGATIVE    Comment:        THE SENSITIVITY OF THIS METHODOLOGY IS >24 mIU/mL   Resp Panel by RT-PCR (Flu A&B, Covid) Nasopharyngeal Swab     Status: None   Collection Time: 01/14/21  6:04 PM   Specimen: Nasopharyngeal Swab; Nasopharyngeal(NP) swabs in vial transport medium  Result Value Ref Range   SARS Coronavirus 2 by RT PCR NEGATIVE NEGATIVE    Comment: (NOTE) SARS-CoV-2 target nucleic acids are NOT DETECTED.  The SARS-CoV-2 RNA is generally detectable in upper respiratory specimens during the acute phase of infection. The lowest concentration of SARS-CoV-2 viral copies this assay can detect is 138 copies/mL. A negative result does not preclude SARS-Cov-2 infection and should not be used as the sole basis for treatment or other patient management decisions. A negative result may occur with  improper specimen collection/handling, submission of specimen other than nasopharyngeal swab, presence of viral mutation(s) within the areas targeted by this assay, and inadequate number of viral copies(<138 copies/mL). A negative result must be combined with clinical observations, patient history, and epidemiological information. The expected result is Negative.  Fact Sheet for Patients:  BloggerCourse.com  Fact Sheet for Healthcare Providers:  SeriousBroker.it  This test is no t yet approved or cleared by the [place] FDA and  has been authorized for detection and/or diagnosis of SARS-CoV-2 by FDA under an Emergency Use Authorization (EUA). This EUA will remain  in effect (meaning this test can be used) for the duration of the COVID-19 declaration under Section 564(b)(1) of the Act, 21 U.S.C.section 360bbb-3(b)(1), unless the authorization is terminated  or revoked sooner.        Influenza A by PCR NEGATIVE NEGATIVE   Influenza B by PCR NEGATIVE NEGATIVE    Comment: (NOTE) The Xpert Xpress SARS-CoV-2/FLU/RSV plus assay is intended as an aid in the diagnosis of influenza from Nasopharyngeal swab specimens and should not be used as a sole basis for treatment. Nasal washings and aspirates are unacceptable for Xpert Xpress SARS-CoV-2/FLU/RSV testing.  Fact Sheet for Patients: BloggerCourse.com  Fact Sheet for Healthcare Providers: SeriousBroker.it  This test is not yet approved or cleared by the [place] FDA and has been authorized for detection and/or diagnosis of  SARS-CoV-2 by FDA under an Emergency Use Authorization (EUA). This EUA will remain in effect (meaning this test can be used) for the duration of the COVID-19 declaration under Section 564(b)(1) of the Act, 21 U.S.C. section 360bbb-3(b)(1), unless the authorization is terminated or revoked.  Performed at [facility], [address] [place], [place] [address]     Assessment and Plan  Upper respiratory tract infection, unspecified type - Information provided on safe medications in pregnancy - Explained to get medications from the list that are under the cold remedies category - Treat symptoms prn   [redacted] weeks gestation of pregnancy  Language Barrier Affecting Healthcare - [facility] In-Person Interpreter, Mattie Marlin used for entire visit   - Discharge patient - Patient verbalized an understanding of the plan of care and agrees.    Raelyn Mora, CNM 01/14/2021, 6:18 PM

## 2021-01-14 NOTE — MAU Note (Signed)
Has been having body aches for 3 days.  Early this morning she had a fever (0200), was burning up,- did not actually take temp.  Has been having sore throat.  Has not started care yet, 2 +test at home about a month ago.  Cough, sometimes productive.  No Tylenol or meds.

## 2021-03-11 ENCOUNTER — Other Ambulatory Visit (INDEPENDENT_AMBULATORY_CARE_PROVIDER_SITE_OTHER): Payer: Self-pay

## 2021-03-11 ENCOUNTER — Other Ambulatory Visit: Payer: Self-pay

## 2021-03-11 DIAGNOSIS — Z3482 Encounter for supervision of other normal pregnancy, second trimester: Secondary | ICD-10-CM

## 2021-03-11 LAB — POCT URINALYSIS DIP (MANUAL ENTRY)
Bilirubin, UA: NEGATIVE
Glucose, UA: NEGATIVE mg/dL
Ketones, POC UA: NEGATIVE mg/dL
Leukocytes, UA: NEGATIVE
Nitrite, UA: NEGATIVE
Protein Ur, POC: NEGATIVE mg/dL
Spec Grav, UA: 1.01 (ref 1.010–1.025)
Urobilinogen, UA: 0.2 E.U./dL
pH, UA: 6.5 (ref 5.0–8.0)

## 2021-03-11 LAB — HEPATITIS C ANTIBODY: HCV Ab: NEGATIVE

## 2021-03-11 LAB — POCT UA - MICROSCOPIC ONLY

## 2021-03-11 NOTE — Addendum Note (Signed)
Addended by: Jennette Bill on: 03/11/2021 10:16 AM   Modules accepted: Orders

## 2021-03-13 LAB — CULTURE, OB URINE

## 2021-03-13 LAB — URINE CULTURE, OB REFLEX

## 2021-03-14 LAB — OBSTETRIC PANEL, INCLUDING HIV
Antibody Screen: NEGATIVE
Basophils Absolute: 0 10*3/uL (ref 0.0–0.2)
Basos: 0 %
EOS (ABSOLUTE): 0.3 10*3/uL (ref 0.0–0.4)
Eos: 5 %
HIV Screen 4th Generation wRfx: NONREACTIVE
Hematocrit: 33.3 % — ABNORMAL LOW (ref 34.0–46.6)
Hemoglobin: 11.6 g/dL (ref 11.1–15.9)
Hepatitis B Surface Ag: NEGATIVE
Immature Grans (Abs): 0 10*3/uL (ref 0.0–0.1)
Immature Granulocytes: 0 %
Lymphocytes Absolute: 1.9 10*3/uL (ref 0.7–3.1)
Lymphs: 31 %
MCH: 31.6 pg (ref 26.6–33.0)
MCHC: 34.8 g/dL (ref 31.5–35.7)
MCV: 91 fL (ref 79–97)
Monocytes Absolute: 0.3 10*3/uL (ref 0.1–0.9)
Monocytes: 5 %
Neutrophils Absolute: 3.5 10*3/uL (ref 1.4–7.0)
Neutrophils: 59 %
Platelets: 165 10*3/uL (ref 150–450)
RBC: 3.67 x10E6/uL — ABNORMAL LOW (ref 3.77–5.28)
RDW: 13.2 % (ref 11.7–15.4)
RPR Ser Ql: NONREACTIVE
Rh Factor: POSITIVE
Rubella Antibodies, IGG: 1.76 index (ref >0.99)
WBC: 6.1 10*3/uL (ref 3.4–10.8)

## 2021-03-14 LAB — HGB FRACTIONATION CASCADE
Hgb A2: 2.8 % (ref 1.8–3.2)
Hgb A: 96.8 % (ref 96.4–98.8)
Hgb F: 0.4 % (ref 0.0–2.0)
Hgb S: 0 %

## 2021-03-14 LAB — HEPATITIS C ANTIBODY: Hep C Virus Ab: <0.1 s/co ratio (ref 0.0–0.9)

## 2021-03-18 ENCOUNTER — Other Ambulatory Visit: Payer: Self-pay

## 2021-03-18 ENCOUNTER — Ambulatory Visit (INDEPENDENT_AMBULATORY_CARE_PROVIDER_SITE_OTHER): Payer: Self-pay | Admitting: Family Medicine

## 2021-03-18 VITALS — BP 102/80 | HR 84 | Wt 184.8 lb

## 2021-03-18 DIAGNOSIS — Z3482 Encounter for supervision of other normal pregnancy, second trimester: Secondary | ICD-10-CM

## 2021-03-18 DIAGNOSIS — Z789 Other specified health status: Secondary | ICD-10-CM

## 2021-03-18 DIAGNOSIS — Z758 Other problems related to medical facilities and other health care: Secondary | ICD-10-CM | POA: Insufficient documentation

## 2021-03-18 DIAGNOSIS — Z3490 Encounter for supervision of normal pregnancy, unspecified, unspecified trimester: Secondary | ICD-10-CM | POA: Insufficient documentation

## 2021-03-18 DIAGNOSIS — O09899 Supervision of other high risk pregnancies, unspecified trimester: Secondary | ICD-10-CM

## 2021-03-18 DIAGNOSIS — O0932 Supervision of pregnancy with insufficient antenatal care, second trimester: Secondary | ICD-10-CM

## 2021-03-18 LAB — POCT 1 HR PRENATAL GLUCOSE: Glucose 1 Hr Prenatal, POC: 164 mg/dL

## 2021-03-18 NOTE — Patient Instructions (Signed)
It was wonderful to meet you today.  Please bring ALL of your medications with you to every visit.   Today we talked about:  If you experience any vaginal bleeding, leakage of fluids, don't feel your baby moving as much, or start to have contractions less than 5 minutes apart please go directly to the Maternal Assessment Unit at [facility] for evaluation.  Maternity and women's care services located on the [place] side of [facility] [place]. [facility] (Entrance C off [address]  [address] [place],  [place]  [address]      Thank you for choosing [facility].   Please call [phone] with any questions about today's appointment.  Please be sure to schedule follow up at the front  desk before you leave today.   [name], DO PGY-1 Family Medicine

## 2021-03-18 NOTE — Progress Notes (Addendum)
Patient Name: Wanda Palmer Date of Birth: [birth year] [facility] Initial Prenatal Visit  Wanda Palmer in-person Spanish interpreter used for entirety of visit.   Wanda Palmer is a 25 y.o. year old G3P2002 at [redacted]w[redacted]d who presents for her initial prenatal visit. Pregnancy is planned She reports  Previously with nausea- first 3 months of pregnancy. Now resolved. . She is taking a prenatal vitamin.  She denies pelvic pain or vaginal bleeding.   Pregnancy Dating: The patient is dated by LMP.  LMP: 10/27/20 Period is certain:  Yes.  Periods were regular:  Yes.  LMP was a typical period:  Yes.  Using hormonal contraception in 3 months prior to conception: No  Lab Review: Blood type: B Rh Status: + Antibody screen: Negative HIV: Negative RPR: Negative Hemoglobin electrophoresis reviewed: Yes Results of OB urine culture are: Negative Rubella: Immune Hep C Ab: Negative Varicella status is Immune  PMH: Reviewed and as detailed below: HTN: No  Gestational Hypertension/preeclampsia: Yes  Type 1 or 2 Diabetes: No  Depression:  No  Seizure disorder:  Yes, previously (more than 7 years ago). Was on medication x1 year but cannot recall which.  VTE: No ,  History of STI Yes, chlamydia.  Abnormal Pap smear:  No, Genital herpes simplex:  No   PSH: Gynecologic Surgery:  no Surgical history reviewed, notable for: None  Obstetric History: Obstetric history tab updated and reviewed.  Summary of prior pregnancies: 6 years, 7 months. 7lb, 8.3 lb Cesarean delivery: No  Gestational Diabetes:  No Hypertension in pregnancy: Yes, at time of delivery.  History of preterm birth: No History of LGA/SGA infant:  No History of shoulder dystocia: No Indications for referral were reviewed, and the patient has no obstetric indications for referral to High Risk OB Clinic at this time.   Social History: Partner's name: Liston Alba  Tobacco use: No Alcohol use:   No Other substance use:  No  Current Medications:  Prenatal Vitamins ASA 81 mg Reviewed and appropriate in pregnancy.   Genetic and Infection Screen: Flow Sheet Updated Yes  Prenatal Exam: Gen: Well nourished, well developed.  No distress.  Vitals noted. Lungs: Breathing comfortably on room air, in no respiratory distress Abd: gravid, soft.  Uterus above level of umbilicus GU: Normal external female genitalia without lesions.  No vaginal discharge.  Bimanual exam: No adnexal mass or TTP. No CMT.  Uterus size above umbilicus Ext: No clubbing, cyanosis or edema. Psych: Normal grooming and dress.  Not depressed or anxious appearing.  Normal thought content and process without flight of ideas or looseness of associations  Fetal heart tones: Appropriate   Assessment/Plan:  Wanda Palmer is a 25 y.o. G3P2002 at [redacted]w[redacted]d who presents to initiate prenatal care. She is doing well.  Current pregnancy issues include None.  Routine prenatal care: As dating is reliable, a dating ultrasound has not been ordered. Dating tab updated. Pre-pregnancy weight updated. Expected weight gain this pregnancy is 11-20 pounds  Prenatal labs reviewed, notable for B+ antibody negative, rubella immune, negative HIV, Hepatitis C and RPR. Indications for referral to HROB were reviewed and the patient does not meet criteria for referral.  Medication list reviewed and updated.  Recommended patient see a dentist for regular care.  Bleeding and pain precautions reviewed. Importance of prenatal vitamins reviewed.  Genetic screening offered. Patient opted for: no screening. The patient does have an indication for aspirin therapy beginning at 12-16 weeks. Aspirin was  recommended today.  The  patient will not be age 35 or over at time of delivery. Referral to genetic counseling was not offered today.  The patient has the following risk factors for preexisting diabetes: BMI > 25 and high risk ethnicity (Latino,  [place] American, Native American, [place] Islander, Asian Naval architect) . An early 1 hour glucose tolerance test was ordered. Pregnancy Medical Home and PHQ-9 forms completed, problems noted: Yes  2. Pregnancy issues include the following which were addressed today:  - Requesting records from the health department for previous Pap smear -Short interval pregnancy: Last delivered 7 months ago -Will need to discuss contraceptives options at f/u -Hx of gestational HTN in prior pregnancy at time of delivery- has been taking 81 mg ASA throughout this pregnancy.  -Language barrier: Spanish-speaking. Interpreter used. -Obesity BMI 32.  Early 1 hour GTT performed today, elevated at 164. Patient has been scheduled for a 3-hour GTT next week. -Limited prenatal care in 2nd trimester: presenting at [redacted]w[redacted]d for initial OB visit.  -Measuring 23 cm today. Has Anatomy US scheduled for 7/21 at 11AM.  Follow up 4 weeks for next prenatal visit.

## 2021-03-23 ENCOUNTER — Other Ambulatory Visit: Payer: Self-pay

## 2021-03-23 ENCOUNTER — Other Ambulatory Visit (INDEPENDENT_AMBULATORY_CARE_PROVIDER_SITE_OTHER): Payer: Self-pay

## 2021-03-23 DIAGNOSIS — Z3482 Encounter for supervision of other normal pregnancy, second trimester: Secondary | ICD-10-CM

## 2021-03-23 LAB — POCT CBG (FASTING - GLUCOSE)-MANUAL ENTRY: Glucose Fasting, POC: 89 mg/dL (ref 70–99)

## 2021-03-24 ENCOUNTER — Encounter: Payer: Self-pay | Admitting: Family Medicine

## 2021-03-24 LAB — GESTATIONAL GLUCOSE TOLERANCE
Glucose, Fasting: 82 mg/dL (ref 65–94)
Glucose, GTT - 1 Hour: 112 mg/dL (ref 65–179)
Glucose, GTT - 2 Hour: 83 mg/dL (ref 65–154)
Glucose, GTT - 3 Hour: 77 mg/dL (ref 65–139)

## 2021-04-11 ENCOUNTER — Encounter (HOSPITAL_COMMUNITY): Payer: Self-pay | Admitting: Obstetrics & Gynecology

## 2021-04-11 ENCOUNTER — Inpatient Hospital Stay (HOSPITAL_COMMUNITY)
Admission: AD | Admit: 2021-04-11 | Discharge: 2021-04-11 | Disposition: A | Discharge status: Home / Self Care | Payer: Self-pay | Attending: Obstetrics & Gynecology | Admitting: Obstetrics & Gynecology

## 2021-04-11 ENCOUNTER — Telehealth: Payer: Self-pay | Admitting: Family Medicine

## 2021-04-11 ENCOUNTER — Other Ambulatory Visit: Payer: Self-pay

## 2021-04-11 DIAGNOSIS — Z3A23 23 weeks gestation of pregnancy: Secondary | ICD-10-CM | POA: Insufficient documentation

## 2021-04-11 DIAGNOSIS — R1013 Epigastric pain: Secondary | ICD-10-CM | POA: Insufficient documentation

## 2021-04-11 DIAGNOSIS — R12 Heartburn: Secondary | ICD-10-CM | POA: Insufficient documentation

## 2021-04-11 DIAGNOSIS — O26892 Other specified pregnancy related conditions, second trimester: Secondary | ICD-10-CM | POA: Insufficient documentation

## 2021-04-11 LAB — COMPREHENSIVE METABOLIC PANEL
ALT: 17 U/L (ref 0–44)
AST: 16 U/L (ref 15–41)
Albumin: 3.1 g/dL — ABNORMAL LOW (ref 3.5–5.0)
Alkaline Phosphatase: 45 U/L (ref 38–126)
Anion gap: 6 (ref 5–15)
BUN: 5 mg/dL — ABNORMAL LOW (ref 6–20)
CO2: 23 mmol/L (ref 22–32)
Calcium: 8.7 mg/dL — ABNORMAL LOW (ref 8.9–10.3)
Chloride: 108 mmol/L (ref 98–111)
Creatinine, Ser: 0.58 mg/dL (ref 0.44–1.00)
GFR, Estimated: >60 mL/min (ref >60)
Glucose, Bld: 90 mg/dL (ref 70–99)
Potassium: 3.8 mmol/L (ref 3.5–5.1)
Sodium: 137 mmol/L (ref 135–145)
Total Bilirubin: 0.2 mg/dL — ABNORMAL LOW (ref 0.3–1.2)
Total Protein: 6.1 g/dL — ABNORMAL LOW (ref 6.5–8.1)

## 2021-04-11 LAB — URINALYSIS, ROUTINE W REFLEX MICROSCOPIC
Bilirubin Urine: NEGATIVE
Glucose, UA: NEGATIVE mg/dL
Ketones, ur: 20 mg/dL — AB
Leukocytes,Ua: NEGATIVE
Nitrite: NEGATIVE
Protein, ur: NEGATIVE mg/dL
Specific Gravity, Urine: 1.016 (ref 1.005–1.030)
pH: 5 (ref 5.0–8.0)

## 2021-04-11 LAB — CBC
HCT: 32.8 % — ABNORMAL LOW (ref 36.0–46.0)
Hemoglobin: 11.5 g/dL — ABNORMAL LOW (ref 12.0–15.0)
MCH: 32.5 pg (ref 26.0–34.0)
MCHC: 35.1 g/dL (ref 30.0–36.0)
MCV: 92.7 fL (ref 80.0–100.0)
Platelets: 171 10*3/uL (ref 150–400)
RBC: 3.54 MIL/uL — ABNORMAL LOW (ref 3.87–5.11)
RDW: 13.4 % (ref 11.5–15.5)
WBC: 9.1 10*3/uL (ref 4.0–10.5)
nRBC: 0 % (ref 0.0–0.2)

## 2021-04-11 LAB — LIPASE, BLOOD: Lipase: 29 U/L (ref 11–51)

## 2021-04-11 MED ORDER — ALUM & MAG HYDROXIDE-SIMETH 200-200-20 MG/5ML PO SUSP
30.0000 mL | Freq: Once | ORAL | Status: AC
  Administered 2021-04-11: 30 mL via ORAL
  Filled 2021-04-11: qty 30

## 2021-04-11 MED ORDER — FAMOTIDINE 20 MG PO TABS: 20.0000 mg | ORAL_TABLET | Freq: Every day | ORAL | 0 refills | Status: DC

## 2021-04-11 MED ORDER — LIDOCAINE VISCOUS HCL 2 % MT SOLN
15.0000 mL | Freq: Once | OROMUCOSAL | Status: AC
  Administered 2021-04-11: 15 mL via ORAL
  Filled 2021-04-11: qty 15

## 2021-04-11 MED ORDER — ACETAMINOPHEN 500 MG PO TABS
1000.0000 mg | ORAL_TABLET | Freq: Once | ORAL | Status: AC
  Administered 2021-04-11: 1000 mg via ORAL
  Filled 2021-04-11: qty 2

## 2021-04-11 NOTE — MAU Note (Signed)
PT SAYS WITH SILVA- INTERPRETER-   AT 5PM- FELT SUDDEN UPER ABD SHARP PAIN-  PAIN IS STILL THERE- 7- DID NOT TAKE ANY MEDS  PNC- FP LAST BM- THIS AM -  LAST ATE- AT 3 PM.  FEELS BABY MOVING

## 2021-04-11 NOTE — Telephone Encounter (Signed)
Someone from [facility] is calling and would like to have a letter clearing patient for dental cleaning. I asked if they had a clearance form to send to be completed and she said they do not.   Patient is at appointment now. She states they did not know who patients PCP was until this morning. She asked if Dr. Melba Coon could write a letter stating it is okay for patient to have dental cleaning. Patient is pregnant. She is not sure if she will need Xrays or any procedures.   The best fax number for Dr. Genia Del is [phone].

## 2021-04-11 NOTE — MAU Provider Note (Signed)
History     CSN: [id number]  Arrival date and time: 04/11/21 2034   Event Date/Time   First Provider Initiated Contact with Patient 04/11/21 2127      Chief Complaint  Patient presents with   Abdominal Pain   HPI Wanda Palmer is a 25 y.o. G3P2002 at [redacted]w[redacted]d who presents with abdominal pain.  Symptoms started at around 5 PM.  States she was cleaning her bathroom when symptoms started.  Describes pain throughout her upper abdomen that feels aching and pinch like.  Has not treated symptoms.  No aggravating or alleviating factors.  Rates pain 7/10.  Denies fever, nausea, vomiting, lower abdominal pain, dysuria, vaginal bleeding, or LOF.  Reports good fetal movement. OB History     Gravida  3   Para  2   Term  2   Preterm  0   AB  0   Living  2      SAB  0   IAB  0   Ectopic  0   Multiple  0   Live Births  2           Past Medical History:  Diagnosis Date   Medical history non-contributory     Past Surgical History:  Procedure Laterality Date   NO PAST SURGERIES      No family history on file.  Social History   Tobacco Use   Smoking status: Never   Smokeless tobacco: Never  Vaping Use   Vaping Use: Never used  Substance Use Topics   Alcohol use: Not Currently   Drug use: Not Currently    Allergies:  Allergies  Allergen Reactions   Pork-Derived Products     Medications Prior to Admission  Medication Sig Dispense Refill Last Dose   acetaminophen (TYLENOL) 325 MG tablet Take 2 tablets (650 mg total) by mouth every 4 (four) hours as needed (for pain scale < 4).   Past Month   aspirin EC 81 MG tablet Take 81 mg by mouth daily. Swallow whole.   04/11/2021   Prenatal Vit-Fe Fumarate-FA (PRENATAL VITAMIN PO) Take by mouth.   04/11/2021   coconut oil OIL Apply 1 application topically as needed.  0     Review of Systems  Constitutional: Negative.   Gastrointestinal:  Positive for abdominal pain. Negative for diarrhea, nausea and  vomiting.  Genitourinary: Negative.   Physical Exam   Blood pressure (!) 106/49, pulse 74, temperature 98.4 F (36.9 C), temperature source Oral, resp. rate 20, height 5\' 1"  (1.549 m), weight 82.6 kg, last menstrual period 10/27/2020, unknown if currently breastfeeding.  Physical Exam Vitals and nursing note reviewed. Exam conducted with a chaperone present.  Constitutional:      General: She is not in acute distress.    Appearance: She is well-developed.  HENT:     Head: Normocephalic and atraumatic.  Eyes:     General: No scleral icterus. Pulmonary:     Effort: Pulmonary effort is normal. No respiratory distress.  Abdominal:     Palpations: Abdomen is soft.     Tenderness: There is abdominal tenderness in the epigastric area. There is no guarding or rebound. Negative signs include Murphy's sign.  Genitourinary:    Comments: Cervix closed/thick Skin:    General: Skin is warm and dry.  Neurological:     Mental Status: She is alert.  Psychiatric:        Mood and Affect: Mood normal.        Behavior: Behavior  normal.   NST:  Baseline: 145 bpm, Variability: Good {> 6 bpm), Accelerations: Non-reactive but appropriate for gestational age, and Decelerations: Variable: mild  MAU Course  Procedures Results for orders placed or performed during the hospital encounter of 04/11/21 (from the past 24 hour(s))  Urinalysis, Routine w reflex microscopic Urine, Clean Catch     Status: Abnormal   Collection Time: 04/11/21  9:02 PM  Result Value Ref Range   Color, Urine YELLOW YELLOW   APPearance HAZY (A) CLEAR   Specific Gravity, Urine 1.016 1.005 - 1.030   pH 5.0 5.0 - 8.0   Glucose, UA NEGATIVE NEGATIVE mg/dL   Hgb urine dipstick MODERATE (A) NEGATIVE   Bilirubin Urine NEGATIVE NEGATIVE   Ketones, ur 20 (A) NEGATIVE mg/dL   Protein, ur NEGATIVE NEGATIVE mg/dL   Nitrite NEGATIVE NEGATIVE   Leukocytes,Ua NEGATIVE NEGATIVE   RBC / HPF 6-10 0 - 5 RBC/hpf   WBC, UA 0-5 0 - 5 WBC/hpf    Bacteria, UA RARE (A) NONE SEEN   Squamous Epithelial / LPF 6-10 0 - 5   Mucus PRESENT   CBC     Status: Abnormal   Collection Time: 04/11/21 10:12 PM  Result Value Ref Range   WBC 9.1 4.0 - 10.5 K/uL   RBC 3.54 (L) 3.87 - 5.11 MIL/uL   Hemoglobin 11.5 (L) 12.0 - 15.0 g/dL   HCT 92.4 (L) 26.8 - 34.1 %   MCV 92.7 80.0 - 100.0 fL   MCH 32.5 26.0 - 34.0 pg   MCHC 35.1 30.0 - 36.0 g/dL   RDW 96.2 22.9 - 79.8 %   Platelets 171 150 - 400 K/uL   nRBC 0.0 0.0 - 0.2 %  Comprehensive metabolic panel     Status: Abnormal   Collection Time: 04/11/21 10:12 PM  Result Value Ref Range   Sodium 137 135 - 145 mmol/L   Potassium 3.8 3.5 - 5.1 mmol/L   Chloride 108 98 - 111 mmol/L   CO2 23 22 - 32 mmol/L   Glucose, Bld 90 70 - 99 mg/dL   BUN 5 (L) 6 - 20 mg/dL   Creatinine, Ser 9.21 0.44 - 1.00 mg/dL   Calcium 8.7 (L) 8.9 - 10.3 mg/dL   Total Protein 6.1 (L) 6.5 - 8.1 g/dL   Albumin 3.1 (L) 3.5 - 5.0 g/dL   AST 16 15 - 41 U/L   ALT 17 0 - 44 U/L   Alkaline Phosphatase 45 38 - 126 U/L   Total Bilirubin 0.2 (L) 0.3 - 1.2 mg/dL   GFR, Estimated >19 >41 mL/min   Anion gap 6 5 - 15  Lipase, blood     Status: None   Collection Time: 04/11/21 10:12 PM  Result Value Ref Range   Lipase 29 11 - 51 U/L    MDM Patient presents with epigastric pain and tenderness over her stomach.  Labs normal.  Pain relieved with Tylenol and GI cocktail.  Will start on daily Pepcid. Fetal tracing appropriate for gestational age and cervix closed/thick  Assessment and Plan   1. Epigastric pain   2. Heartburn during pregnancy in second trimester   3. [redacted] weeks gestation of pregnancy    -Rx pepcid -reviewed reasons to return to MAU  Judeth Horn 04/11/2021, 9:27 PM

## 2021-04-13 ENCOUNTER — Telehealth: Payer: Self-pay | Admitting: Family Medicine

## 2021-04-13 NOTE — Telephone Encounter (Signed)
Letter for dental cleaning sent.

## 2021-04-13 NOTE — Telephone Encounter (Signed)
Faxed to Dr. Genia Del at [phone].  Glennie Hawk, CMA

## 2021-04-18 NOTE — Patient Instructions (Addendum)
It was wonderful to see you today.  Please bring ALL of your medications with you to every visit.   Today we talked about:  -You have an appointment tomorrow for a 3-hour glucose test. Please arrive at 8:30 AM. Do not eat or drink anything past midnight.  -I will see you again in 4-weeks for your next appointment.  Fue maravilloso verte hoy.  Por favor traiga TODOS sus medicamentos a cada visita.  Hoy hablamos de:  -Tienes una cita maana para una prueba de glucosa de 3 horas. Por favor llegue a las 8:30 AM. No coma ni beba nada pasada la medianoche. -Te ver de nuevo en 4 semanas para tu prxima cita.  Si experimenta sangrado vaginal, prdida de lquidos, no siente que su beb se mueva tanto o comienza a Technical brewer con menos de 5 minutos de diferencia, vaya directamente a la Unidad de Evaluacin Materna en [facility] para una evaluacin.  Servicios de atencin de mujeres y maternidad ubicados en el lado sur de [place]. [facility] (Entrada C en [address] [place], [place] [address]      Thank you for choosing [facility].   Please call [phone] with any questions about today's appointment.  Please be sure to schedule follow up at the front  desk before you leave today.   Wanda Dick, DO PGY-2 Family Medicine

## 2021-04-18 NOTE — Progress Notes (Signed)
  [facility] Prenatal Visit  In-person Spanish interpreter Alysia Penna [phone] used for entirety of visit.   Wanda Palmer is a 25 y.o. G3P2002 at [redacted]w[redacted]d here for routine follow up. She is dated by LMP.  She reports heartburn and this is relieved with Famotidine .  She reports good fetal movement. No bleeding, loss of fluid, contractions. See flow sheet for details. Vitals:   04/19/21 1358  BP: 102/78  Pulse: 84    A/P: Pregnancy at [redacted]w[redacted]d.  Doing well.   Dating reviewed, dating tab is correct Fetal heart tones Appropriate Fundal height within expected range.  Anatomy ultrasound reviewed and without  abnormalities.   Influenza vaccine not administered as not influenza season. Marland Kitchen  COVID vaccination was discussed and patient declines.   Indications for screening for preexisting diabetes include: BMI > 25 and high risk ethnicity (Latino, [place] American, Native American, Mal[place] Islander, Asian Naval architect) . Previously done 1-hr GTT and failed but passed 3-hour GTT.  Pregnancy education provided on the following topics: fetal growth and movement, ultrasound assessment, and upcoming laboratory assessment.   Unable to schedule for [facility] during second trimester as there were no availabilities. Preterm labor precautions given.   2. Pregnancy issues include the following and were addressed as appropriate today:  Late to prenatal care at 20 weeks History of gestational hypertension/pre-E. Taking 81 mg ASA daily. BP normotensive today. History of gestational thrombocytopenia. Repeat CBC at next appointment. GERD: well controlled with Famotidine. Remote history of seizure (over 7 years ago). Not on anti-epileptics.  Short-interval pregnancy. Last delivered November 2021. Desires tubal ligation for birth control.  Failed early 1-hour GTT, normal 3-hour.  3-hour GTT scheduled for tomorrow. Language-barrier. She is Spanish speaking.  Problem list and pregnancy box  updated: Yes.    Follow up with me in 4 weeks.

## 2021-04-19 ENCOUNTER — Other Ambulatory Visit: Payer: Self-pay | Admitting: Family Medicine

## 2021-04-19 ENCOUNTER — Ambulatory Visit (INDEPENDENT_AMBULATORY_CARE_PROVIDER_SITE_OTHER): Payer: Self-pay | Admitting: Family Medicine

## 2021-04-19 ENCOUNTER — Other Ambulatory Visit: Payer: Self-pay

## 2021-04-19 ENCOUNTER — Encounter: Payer: Self-pay | Admitting: Family Medicine

## 2021-04-19 VITALS — BP 102/78 | HR 84 | Wt 184.8 lb

## 2021-04-19 DIAGNOSIS — Z3482 Encounter for supervision of other normal pregnancy, second trimester: Secondary | ICD-10-CM

## 2021-04-19 DIAGNOSIS — Z131 Encounter for screening for diabetes mellitus: Secondary | ICD-10-CM

## 2021-04-19 DIAGNOSIS — R7309 Other abnormal glucose: Secondary | ICD-10-CM

## 2021-04-20 ENCOUNTER — Other Ambulatory Visit (INDEPENDENT_AMBULATORY_CARE_PROVIDER_SITE_OTHER): Payer: Self-pay

## 2021-04-20 DIAGNOSIS — Z3482 Encounter for supervision of other normal pregnancy, second trimester: Secondary | ICD-10-CM

## 2021-04-20 LAB — POCT CBG (FASTING - GLUCOSE)-MANUAL ENTRY: Glucose Fasting, POC: 95 mg/dL (ref 70–99)

## 2021-04-21 LAB — GESTATIONAL GLUCOSE TOLERANCE
Glucose, Fasting: 82 mg/dL (ref 65–94)
Glucose, GTT - 1 Hour: 140 mg/dL (ref 65–179)
Glucose, GTT - 2 Hour: 103 mg/dL (ref 65–154)
Glucose, GTT - 3 Hour: 91 mg/dL (ref 65–139)

## 2021-05-15 NOTE — Progress Notes (Signed)
  [facility] Prenatal Visit  Wanda Palmer [name] is a 25 y.o. G3P2002 at [redacted]w[redacted]d here for routine follow up. She is dated by LMP.  She reports no complaints. She reports fetal movement. She denies vaginal bleeding, contractions, or loss of fluid. See flow sheet for details.  Vitals:   05/17/21 1357  BP: 100/80  Pulse: 90    A/P: Pregnancy at [redacted]w[redacted]d.  Doing well.   Routine prenatal care:  Dating reviewed, dating tab is correct Fetal heart tones Appropriate Fundal height within expected range.  Infant feeding choice: Breastfeeding Contraception choice: Tubal ligation, forms to be signed at 32 weeks  Infant circumcision desired not applicable  The patient does not have a history of Cesarean delivery and no referral to Center for [facility] Health is indicated Influenza vaccine not administered as not influenza season.   Tdap was not given today, will need to be given at [facility] Department. Letter provided. CBC, RPR, and HIV were obtained today.    Rh status was reviewed and patient does not need Rhogam.  Rhogam was not given today.  Pregnancy medical home and PHQ-9 forms were done today and reviewed.   Childbirth and education classes were offered and patient declined. Pregnancy education regarding benefits of breastfeeding, contraception, fetal growth, expected weight gain, and safe infant sleep were discussed.  Preterm labor and fetal movement precautions reviewed.  2. Pregnancy issues include the following and were addressed as appropriate today:  Late to prenatal care at 20 weeks COVID vaccine offered today, patient declined.  History of gestational hypertension/pre-E. Taking 81 mg ASA daily. BP normotensive today. History of gestational thrombocytopenia. Will get repeat CBC.  Needs Tdap at HD, letter provided to patient today. GERD: well controlled with Famotidine. Remote history of seizure (over 7 years ago). Not on anti-epileptics.  Short-interval pregnancy.  Last delivered November 2021. Desires tubal ligation for birth control.  Patient is uninsured which could pose financial difficulties.  Discussed this with patient, she is still very adamant about tubal ligation she does not desire any more pregnancies.  Discussed IUD as alternative, she does not want any other method that would not be 100% effective.  Failed early 1-hour GTT. Second trimester 3-hr GTT normal.   Problem list and pregnancy box updated: Yes.   Patient scheduled with follow up with Dr. Nobie Putnam.  Follow up 2 weeks.

## 2021-05-15 NOTE — Patient Instructions (Addendum)
Fue maravilloso verte hoy.  Por favor traiga TODOS sus medicamentos a cada visita.  Hoy hablamos de:  Le di una carta para que se la d al Departamento de Salud para su vacuna Tdap.  Estamos revisando los laboratorios hoy, te llamar con [place].  Si experimenta sangrado vaginal, prdida de lquidos, no siente que su beb se mueva tanto o comienza a Technical brewer con menos de 5 minutos de diferencia, vaya directamente a la Unidad de Evaluacin Materna en [facility] para una evaluacin.  Servicios de atencin de mujeres y maternidad ubicados en el lado sur de [place]. [facility] (Entrada C en [address]). [address] [name], [place] [address]     [name] elegir Medicina familiar de [place].  Llame al [phone] si tiene alguna pregunta sobre la cita de [place].  Asegrese de programar un seguimiento en la recepcin antes de irse hoy.  [name], D.O. PGY-2 Medicina Familiar

## 2021-05-17 ENCOUNTER — Ambulatory Visit (INDEPENDENT_AMBULATORY_CARE_PROVIDER_SITE_OTHER): Payer: Self-pay | Admitting: Family Medicine

## 2021-05-17 ENCOUNTER — Other Ambulatory Visit: Payer: Self-pay

## 2021-05-17 VITALS — BP 100/80 | HR 90 | Wt 189.8 lb

## 2021-05-17 DIAGNOSIS — Z3482 Encounter for supervision of other normal pregnancy, second trimester: Secondary | ICD-10-CM

## 2021-05-18 LAB — RPR: RPR Ser Ql: NONREACTIVE

## 2021-05-18 LAB — CBC
Hematocrit: 35.1 % (ref 34.0–46.6)
Hemoglobin: 11.8 g/dL (ref 11.1–15.9)
MCH: 31.6 pg (ref 26.6–33.0)
MCHC: 33.6 g/dL (ref 31.5–35.7)
MCV: 94 fL (ref 79–97)
Platelets: 158 10*3/uL (ref 150–450)
RBC: 3.73 x10E6/uL — ABNORMAL LOW (ref 3.77–5.28)
RDW: 12.9 % (ref 11.7–15.4)
WBC: 7.4 10*3/uL (ref 3.4–10.8)

## 2021-05-18 LAB — HIV ANTIBODY (ROUTINE TESTING W REFLEX): HIV Screen 4th Generation wRfx: NONREACTIVE

## 2021-05-31 ENCOUNTER — Encounter: Payer: Self-pay | Admitting: Family Medicine

## 2021-06-03 ENCOUNTER — Ambulatory Visit (INDEPENDENT_AMBULATORY_CARE_PROVIDER_SITE_OTHER): Payer: Self-pay | Admitting: Family Medicine

## 2021-06-03 ENCOUNTER — Other Ambulatory Visit: Payer: Self-pay

## 2021-06-03 VITALS — BP 102/59 | HR 90 | Wt 193.8 lb

## 2021-06-03 DIAGNOSIS — Z3482 Encounter for supervision of other normal pregnancy, second trimester: Secondary | ICD-10-CM

## 2021-06-03 NOTE — Progress Notes (Signed)
  [facility] Prenatal Visit  Quintina Hakeem [name] is a 25 y.o. G3P2002 at [redacted]w[redacted]d here for routine follow up. She is dated by LMP.  She reports no bleeding, no contractions, no cramping, and no leaking.  She reports fetal movement. She denies vaginal bleeding, contractions, or loss of fluid.  See flow sheet for details.  Vitals:   06/03/21 1537  BP: (!) 102/59  Pulse: 90     A/P: Pregnancy at [redacted]w[redacted]d.  Doing well.   Routine prenatal care:  Dating reviewed, dating tab is correct Fetal heart tones: Appropriate 136 Fundal height: within expected range.  32 The patient does not have a history of HSV and valacyclovir is not indicated at this time.  The patient does not have a history of Cesarean delivery and no referral to Center for [facility] Health is indicated Infant feeding choice: Formula feeding  Contraception choice: Tubal ligation but wants to find out more about cost.  Infant circumcision desired not applicable Influenza vaccine not administered, will give letter.  Tdap letter given today.  COVID vaccination was discussed and patient declined.  Childbirth and education classes were not offered. Pregnancy education regarding benefits of breastfeeding, contraception, fetal growth, expected weight gain, and safe infant sleep were discussed.  Preterm labor and fetal movement precautions reviewed.   2. Pregnancy issues include the following and were addressed as appropriate today: Late prenatal care started at 20 weeks History of gestational hypertension/bradycardia.  Patient currently taking 81 mg ASA daily.  Blood pressure within normal limits today. Patient previously received letter for Tdap and flu vaccine.  Forgot to give patient accurate letter today.  Please be sure to give at next visit. GERD-well-controlled on famotidine Short interval pregnancy-delivered November 2021.  Desires tubal ligation for birth control but is uninsured.  Is going to work on finding  the cost of a tubal ligation. Failed early 1 hour GTT.  Second trimester 3-hour GTT within normal limits.   Problem list and pregnancy box updated: Yes.   Scheduled for Ob Faculty clinic in third trimester on 10/20.   Follow up 2 weeks.

## 2021-06-03 NOTE — Patient Instructions (Signed)
It was wonderful seeing you today.  Your baby sounds great and you are growing appropriately.  I have no concerns regarding your pregnancy at this time.  Regarding your wish for a tubal we will continue to try and find out the cost of the tubal but I recommend considering another form of birth control until then.  If you have any contractions, vaginal bleeding, gush of fluid, or you feel decreased baby movement please seek medical attention at the maternal assessment unit.  I hope you have a wonderful afternoon!

## 2021-06-16 ENCOUNTER — Other Ambulatory Visit: Payer: Self-pay

## 2021-06-16 ENCOUNTER — Ambulatory Visit (INDEPENDENT_AMBULATORY_CARE_PROVIDER_SITE_OTHER): Payer: Self-pay | Admitting: Family Medicine

## 2021-06-16 ENCOUNTER — Other Ambulatory Visit (HOSPITAL_COMMUNITY)
Admission: RE | Admit: 2021-06-16 | Discharge: 2021-06-16 | Disposition: A | Discharge status: Home / Self Care | Payer: Self-pay | Source: Ambulatory Visit | Attending: Family Medicine | Admitting: Family Medicine

## 2021-06-16 VITALS — BP 115/62 | HR 94 | Wt 195.4 lb

## 2021-06-16 DIAGNOSIS — Z3482 Encounter for supervision of other normal pregnancy, second trimester: Secondary | ICD-10-CM | POA: Insufficient documentation

## 2021-06-16 LAB — OB RESULTS CONSOLE GC/CHLAMYDIA: Gonorrhea: NEGATIVE

## 2021-06-16 NOTE — Patient Instructions (Signed)
It was great seeing you today.  I am glad you are doing well.  I have provided a letter for you to get the flu and Tdap vaccines at the health department.  If you have any vaginal bleeding, gush of fluid, contractions please seek medical attention at the maternal assessment unit in the bottom of the [facility].  I hope you have a wonderful afternoon!

## 2021-06-16 NOTE — Progress Notes (Signed)
  [facility] Prenatal Visit  Wanda Palmer is a 25 y.o. G3P2002 at [redacted]w[redacted]d here for routine follow up. She is dated by LMP.  She reports bleeding, no bleeding, no contractions, and no cramping.  She reports fetal movement. She denies vaginal bleeding, contractions, or loss of fluid.  See flow sheet for details.  There were no vitals filed for this visit.   A/P: Pregnancy at [redacted]w[redacted]d.  Doing well.   Routine prenatal care:  Dating reviewed, dating tab is correct Fetal heart tones: Appropriate 151 Fundal height: within expected range.  34 cm The patient does not have a history of HSV and valacyclovir is not indicated at this time.  The patient does not have a history of Cesarean delivery and no referral to [facility] is indicated Infant feeding choice: Formula feeding  Contraception choice: Patient would like a tubal ligation and would like more information on the cost.  She has been provided with the number for billing. Infant circumcision desired not applicable Influenza vaccine not administered today.  Patient is going to receive at the health department. Tdap was not given today.  Patient is going to receive at the health department COVID vaccination was discussed and patient has declined.  Childbirth and education classes were not offered. Pregnancy education regarding benefits of breastfeeding, contraception, fetal growth, expected weight gain, and safe infant sleep were discussed.  Preterm labor and fetal movement precautions reviewed.   2. Pregnancy issues include the following and were addressed as appropriate today: Patient had late prenatal care which was initiated at 20 weeks. Patient has not had GC/chlamydia testing this pregnancy, it was collected today.  Wet prep also collected. Patient has a history of gestational hypertension with bradycardia.  Currently taking 81 mg aspirin daily.  Blood pressures are within normal limits  today. GERD-patient is well controlled on famotidine Short interval pregnancy: Patient last delivered in November 2021.  She desires a tubal ligation and we are working on finding out the cost. Patient failed her 1 hour GTT in early trimester.  Second trimester 3-hour GTT was within normal limits.   Problem list and pregnancy box updated: Yes.   Scheduled for Ob Faculty clinic in third trimester on 10/20.   Follow up 2 weeks.

## 2021-06-17 LAB — CERVICOVAGINAL ANCILLARY ONLY
Bacterial Vaginitis (gardnerella): NEGATIVE
Candida Glabrata: NEGATIVE
Candida Vaginitis: NEGATIVE
Chlamydia: NEGATIVE
Comment: NEGATIVE
Comment: NEGATIVE
Comment: NEGATIVE
Comment: NEGATIVE
Comment: NEGATIVE
Comment: NORMAL
Neisseria Gonorrhea: NEGATIVE
Trichomonas: NEGATIVE

## 2021-06-30 ENCOUNTER — Encounter (HOSPITAL_COMMUNITY): Payer: Self-pay | Admitting: Obstetrics and Gynecology

## 2021-06-30 ENCOUNTER — Inpatient Hospital Stay (HOSPITAL_BASED_OUTPATIENT_CLINIC_OR_DEPARTMENT_OTHER): Payer: Self-pay

## 2021-06-30 ENCOUNTER — Ambulatory Visit (INDEPENDENT_AMBULATORY_CARE_PROVIDER_SITE_OTHER): Payer: Self-pay | Admitting: Family Medicine

## 2021-06-30 ENCOUNTER — Inpatient Hospital Stay (HOSPITAL_COMMUNITY)
Admission: AD | Admit: 2021-06-30 | Discharge: 2021-06-30 | Disposition: A | Discharge status: Home / Self Care | Payer: Self-pay | Attending: Obstetrics and Gynecology | Admitting: Obstetrics and Gynecology

## 2021-06-30 ENCOUNTER — Other Ambulatory Visit: Payer: Self-pay

## 2021-06-30 DIAGNOSIS — O368190 Decreased fetal movements, unspecified trimester, not applicable or unspecified: Secondary | ICD-10-CM

## 2021-06-30 DIAGNOSIS — Z3483 Encounter for supervision of other normal pregnancy, third trimester: Secondary | ICD-10-CM

## 2021-06-30 DIAGNOSIS — O368130 Decreased fetal movements, third trimester, not applicable or unspecified: Secondary | ICD-10-CM | POA: Insufficient documentation

## 2021-06-30 DIAGNOSIS — Z7982 Long term (current) use of aspirin: Secondary | ICD-10-CM | POA: Insufficient documentation

## 2021-06-30 DIAGNOSIS — Z3A35 35 weeks gestation of pregnancy: Secondary | ICD-10-CM | POA: Insufficient documentation

## 2021-06-30 NOTE — Patient Instructions (Signed)
It was great seeing you today.  I am concerned because of your report of decreased fetal movement for the past 2 to 3 days.  Your baby's heart rate was normal in it appears you are growing appropriately but I want you to be assessed in the maternal assessment unit in the bottom of the [facility].  You are already scheduled for your next appointment on 10/20 and we are making an appointment for you in the morning on 10/27.  If you have any vaginal bleeding, contractions, gush of fluid or feel decreased fetal movement between now and then please be reassessed in the maternal assessment unit.  I hope you have a wonderful afternoon!

## 2021-06-30 NOTE — MAU Note (Signed)
Denies pain, no leaking or bleeding.  Decreased fetal movement past 3 days. Still moving, but not as much.  Received flu vaccine on Monday, wasn't feeling well that day.

## 2021-06-30 NOTE — MAU Provider Note (Signed)
History     [id number]  Arrival date and time: 06/30/21 1233    Chief Complaint  Patient presents with   Decreased Fetal Movement     HPI Wanda Palmer is a 25 y.o. at [redacted]w[redacted]d by LMP with PMHx notable for gestational HTN and thrombocytopenia in a prior pregnancy, who presents for decreased fetal movement.   Reports she has had several days of decreased fetal movement Was at a prenatal appt earlier today and sent to MAU for evaluation No loss of fluid or vaginal bleeding No contractions  B/Positive/-- (06/24 1007)  OB History     Gravida  3   Para  2   Term  2   Preterm  0   AB  0   Living  2      SAB  0   IAB  0   Ectopic  0   Multiple  0   Live Births  2           Past Medical History:  Diagnosis Date   Medical history non-contributory     Past Surgical History:  Procedure Laterality Date   NO PAST SURGERIES      Family History  Problem Relation Age of Onset   Learning disabilities Father     Social History   Socioeconomic History   Marital status: Single    Spouse name: Not on file   Number of children: Not on file   Years of education: Not on file   Highest education level: Not on file  Occupational History   Not on file  Tobacco Use   Smoking status: Never   Smokeless tobacco: Never  Vaping Use   Vaping Use: Never used  Substance and Sexual Activity   Alcohol use: Not Currently   Drug use: Not Currently   Sexual activity: Yes    Birth control/protection: None  Other Topics Concern   Not on file  Social History Narrative   ** Merged History Encounter **       Social Determinants of Health   Financial Resource Strain: Not on file  Food Insecurity: Not on file  Transportation Needs: Not on file  Physical Activity: Not on file  Stress: Not on file  Social Connections: Not on file  Intimate Partner Violence: Not on file    Allergies  Allergen Reactions   Pork-Derived Products     No current  facility-administered medications on file prior to encounter.   Current Outpatient Medications on File Prior to Encounter  Medication Sig Dispense Refill   acetaminophen (TYLENOL) 325 MG tablet Take 2 tablets (650 mg total) by mouth every 4 (four) hours as needed (for pain scale < 4).     aspirin EC 81 MG tablet Take 81 mg by mouth daily. Swallow whole.     famotidine (PEPCID) 20 MG tablet Take 1 tablet (20 mg total) by mouth daily. 30 tablet 0   Prenatal Vit-Fe Fumarate-FA (PRENATAL VITAMIN PO) Take by mouth.       ROS Pertinent positives and negative per HPI, all others reviewed and negative  Physical Exam   BP 117/68 (BP Location: Right Arm)   Pulse 88   Temp 98.1 F (36.7 C) (Oral)   Resp 16   Wt 88.2 kg   LMP 10/27/2020   BMI 36.75 kg/m   Patient Vitals for the past 24 hrs:  BP Temp Temp src Pulse Resp Weight  06/30/21 1252 117/68 98.1 F (36.7 C) Oral 88 16 88.2  kg    Physical Exam Vitals reviewed.  Constitutional:      General: She is not in acute distress.    Appearance: She is well-developed. She is not diaphoretic.  Eyes:     General: No scleral icterus. Pulmonary:     Effort: Pulmonary effort is normal. No respiratory distress.  Skin:    General: Skin is warm and dry.  Neurological:     Mental Status: She is alert.     Coordination: Coordination normal.     Cervical Exam    Bedside Ultrasound Not done  My interpretation: n/a  FHT Baseline 150, moderate variability, positive accels, no decels Toco: rare small ctx Cat: I  Labs No results found for this or any previous visit (from the past 24 hour(s)).  Imaging No results found.  MAU Course  Procedures Lab Orders  No laboratory test(s) ordered today   No orders of the defined types were placed in this encounter.  Imaging Orders         [place] MFM FETAL BPP WO NON STRESS     MDM mild  Assessment and Plan  #Decreased fetal movement NST reactive and BPP 8/8. Images reviewed  personally, numerous body rolls and limb movements recorded and good tone. Two AFI measurements taken but both normal and with MVP>2 cm. Reports she still has DFM but reassured patient that testing has been normal. Will message primary OB provider, recommend weekly BPP if persistent and consider discussing IOL at 37 weeks if it does not improve.   #FWB FHT Cat I NST: Reactive   Dispo: Discharged to home in stable condition with return precautions.   Venora Maples, MD/MPH 06/30/21 2:48 PM  Allergies as of 06/30/2021       Reactions   Pork-derived Products         Medication List     TAKE these medications    acetaminophen 325 MG tablet Commonly known as: Tylenol Take 2 tablets (650 mg total) by mouth every 4 (four) hours as needed (for pain scale < 4).   aspirin EC 81 MG tablet Take 81 mg by mouth daily. Swallow whole.   famotidine 20 MG tablet Commonly known as: Pepcid Take 1 tablet (20 mg total) by mouth daily.   PRENATAL VITAMIN PO Take by mouth.

## 2021-06-30 NOTE — Progress Notes (Signed)
  [facility] Prenatal Visit  Wyatt Galvan Wanda Palmer is a 25 y.o. G3P2002 at [redacted]w[redacted]d here for routine follow up. She is dated by LMP.  She reports no contractions, no cramping, no leaking, and reports considerably decreased fetal movement over the past 2-3 days .  She reports fetal movement. She denies vaginal bleeding, contractions, or loss of fluid.  See flow sheet for details.  Vitals:   06/30/21 1149  BP: 107/69  Pulse: 91     A/P: Pregnancy at [redacted]w[redacted]d.  Doing well.   Routine prenatal care:  Dating reviewed, dating tab is correct Fetal heart tones: Appropriate 134 Fundal height: within expected range.  136 The patient does not have a history of HSV and valacyclovir is not indicated at this time.  The patient does not have a history of Cesarean delivery and no referral to [facility] is indicated Infant feeding choice: Formula feeding  Contraception choice: Patient is still requesting a tubal ligation.  She is working on finding out information regarding the cost of a tubal ligation Infant circumcision desired not applicable Influenza vaccine previously administered.   Tdap was not given today. COVID vaccination was discussed and patient declined.  Childbirth and education classes were not offered. Pregnancy education regarding benefits of breastfeeding, contraception, fetal growth, expected weight gain, and safe infant sleep were discussed.  Preterm labor and fetal movement precautions reviewed.   2. Pregnancy issues include the following and were addressed as appropriate today: Decreased fetal movement: Patient reports that over the last 2 to 3 days she has felt little very little baby movement.  We discussed kick counts and she reports that she is definitely not feeling 6 or more in 1 hour or 10 or more than 2 hours.  She reports she is concerned because this is very different from previous pregnancies.  Recommended patient go to the maternal assessment  unit for further evaluation.Spoke with MAU provider after patient's evaluation and BPP was 10/10.  He recommended that if she continues to feel decreased fetal movement over the next week he would do weekly BPP's and talk to MFM about possible IOL at 37 weeks Late prenatal care: Initiated 20 late weeks Patient will need GC/chlamydia and GBS testing at next visit History of gestational hypertension with bradycardia: Patient currently taking 81 mg aspirin daily GERD: Patient well-controlled on famotidine Short interval pregnancy: Patient delivered in November 2021.  She desires tubal ligation after this delivery and is trying to find out how much that will cost.  Discussed with her at length in the MAU today regarding other possible contraceptive measures but she is only interested in the surgery Patient failed her 1 hour GTT in early trimester but second trimester 3-hour GTT was within normal limits   Problem list and pregnancy box updated: Yes.   Scheduled for Ob Faculty clinic in third trimester on 10/20.   Follow up 1 weeks.

## 2021-07-07 ENCOUNTER — Ambulatory Visit (INDEPENDENT_AMBULATORY_CARE_PROVIDER_SITE_OTHER): Payer: Self-pay

## 2021-07-07 ENCOUNTER — Other Ambulatory Visit: Payer: Self-pay

## 2021-07-07 ENCOUNTER — Ambulatory Visit (INDEPENDENT_AMBULATORY_CARE_PROVIDER_SITE_OTHER): Payer: Self-pay | Admitting: Family Medicine

## 2021-07-07 DIAGNOSIS — Z8759 Personal history of other complications of pregnancy, childbirth and the puerperium: Secondary | ICD-10-CM

## 2021-07-07 DIAGNOSIS — Z3483 Encounter for supervision of other normal pregnancy, third trimester: Secondary | ICD-10-CM

## 2021-07-07 DIAGNOSIS — Z23 Encounter for immunization: Secondary | ICD-10-CM

## 2021-07-07 NOTE — Progress Notes (Signed)
  [facility] Prenatal Visit  Wanda Palmer Wanda Palmer is a 25 y.o. G3P2002 at [redacted]w[redacted]d here for routine follow up. She is dated by LMP.  She reports no complaints. She reports fetal movement. She denies vaginal bleeding, contractions, or loss of fluid. See flow sheet for details.  Vitals:   07/07/21 0908  BP: (!) 119/56  Pulse: 92    A/P: Pregnancy at [redacted]w[redacted]d.  Doing well.   Routine prenatal care  Dating reviewed, dating tab is correct Fetal heart tones Appropriate - 142 bpm Fundal height within expected range.  36 cm Fetal position confirmed Vertex using Ultrasound .  GBS collected today. .  Repeat GC/CT not collected today due to previous recent collection at end of September.  The patient does not have a history of HSV and valacyclovir is not indicated at this time.  Infant feeding choice: Formula feeding  Contraception choice: Undecided  Infant circumcision desired not applicable Influenza vaccine previously administered.   Tdap previously administered between 27-36 weeks  COVID vaccination was discussed and given today.  Pregnancy education regarding preterm labor, fetal movement,  benefits of breastfeeding, contraception, fetal growth, expected weight gain, and safe infant sleep were discussed.    2. Pregnancy issues include the following and were addressed as appropriate today:   BTL: patient desires post-partum bilateral tubal ligation for contraception. Discussed alternatives. Patient uninsured but willing to pay cash pay price. She will need to notify the OB provider at time of delivery, who will organize the surgery for 6 weeks post-partum.   Problem list and pregnancy box updated: Yes.  Follow up 1 week.    Fayette Pho, MD

## 2021-07-07 NOTE — Patient Instructions (Addendum)
I have translated the following text using Google translate.  As such, there are many errors.  I apologize for the poor written translation; however, we do not have written  translation services yet.  He traducido el siguiente texto [employer] traductor de [employer]. Como tal, hay muchos errores. Pido disculpas por la mala traduccin escrita; sin embargo, an no contamos con servicios de traduccin escrita.  It was wonderful to meet you today. Thank you for allowing me to be a part of your care. Below is a short summary of what we discussed at your visit today: Fue maravilloso conocerte hoy. Gracias por permitirme ser parte de su cuidado. A continuacin se muestra un breve resumen de lo que discutimos en su visita de hoy:  Baby growth  Your baby appears to be doing very well. Heart rate and belly measurements were normal today.  crecimiento del bebe Su beb parece estar muy bien. Las mediciones de la frecuencia cardaca y del vientre fueron normales hoy.  Bilateral tubal ligation for contraception Since you have the money and are willing to pay cash prices, we believe you will be able to ask for the surgery at the time of delivery. We will check with the OB doctors who work at [facility] and make sure you do not need to sign paperwork ahead of time.  Ligadura de trompas bilateral para la anticoncepcin Dado que tiene el dinero y est dispuesto a Economist los precios en efectivo, creemos que podr Radio broadcast assistant la ciruga en el momento del [place]. Consultaremos con los mdicos obstetras que trabajan en [facility] y nos aseguraremos de que no tenga que firmar documentos con anticipacin.  Emergency Planning  Planificacin de emergencias Si experimenta sangrado vaginal, prdida de lquidos, no siente que su beb se mueva tanto o comienza a Technical brewer con menos de 5 minutos de diferencia, vaya directamente a la Unidad de Evaluacin Materna en [facility] para una evaluacin.  Servicios de atencin de  mujeres y maternidad ubicados en el lado sur de [place]. [facility] (Entr[facility] en [address] [place], [place] [address]     Maternal Mental Health If you start to develop the below symptoms of depression, please reach out to [place] for an appointment. There is also a Biomedical scientist Health Hotline at [phone] 2818220956). This hotline has trained counselors, doulas, and midwifes to real-time support, information, and resources.  Feeling sad or hopeless most of the time Lack of interest in things you used to enjoy Less interest in caring for yourself (dressing, fixing hair) Trouble concentrating Trouble coping with daily tasks Constant worry about your baby Sleeping or eating too much or too little Feeling very anxious or nervous Unexplained irritability or anger Unwanted or scary thoughts Feeling that you are not a good mother Thoughts of hurting yourself or your baby  If you feel you are experiencing a mental health crisis, please reach out to the National Suicide Prevention Hotline at 1-800-273-TALK 708-525-7275) or go directly to the [facility], open 24/7.  [phone] [address] [place], [place] [address]   Salud Mental Materna Si comienza a desarrollar los siguientes sntomas de depresin, comunquese con nosotros para programar una cita. Tambin hay una lnea directa nacional de salud mental materna en el 1-833-9-HELP4MOMS [phone]). Esta lnea directa ha capacitado a consejeros, doulas y parteras para brindar apoyo, informacin y recursos en tiempo real.  Lolita Lenz o sin esperanza  la mayor parte del tiempo  Falta de inters en las cosas que sola disfrutar  Menos inters en cuidarse a s mismo (vestirse, arreglarse el cabello)  Problemas para concentrarse  Problemas para hacer frente a las tareas diarias  Preocupacin constante por su  beb  Dormir o Microbiologist o muy poco  Sentirse muy ansioso o nervioso  Irritabilidad o ira inexplicables  Pensamientos no deseados o Insurance underwriter que no eres una buena madre  Pensamientos de Runner, broadcasting/film/video a s Visual merchandiser o a su beb  Si cree que est experimentando una crisis de salud mental, comunquese con la Lnea [employer] de Prevencin del Suicidio al 7-915-056-PVXY [phone]) o dirjase directamente a la Atencin de [place] de Salud del Comportamiento del [place] de [place], [place] 24/7. [phone] [address], [place], [place] [address]  Please bring all of your medications to every appointment!  If you have any questions or concerns, please do not hesitate to contact us via phone or MyChart message.   Fayette Pho, MD

## 2021-07-11 LAB — CULTURE, BETA STREP (GROUP B ONLY): Strep Gp B Culture: NEGATIVE

## 2021-07-14 ENCOUNTER — Ambulatory Visit (INDEPENDENT_AMBULATORY_CARE_PROVIDER_SITE_OTHER): Payer: Self-pay | Admitting: Family Medicine

## 2021-07-14 ENCOUNTER — Other Ambulatory Visit: Payer: Self-pay

## 2021-07-14 DIAGNOSIS — Z3483 Encounter for supervision of other normal pregnancy, third trimester: Secondary | ICD-10-CM

## 2021-07-14 NOTE — Progress Notes (Signed)
  [facility] Prenatal Visit  Wanda Palmer is a 25 y.o. G3P2002 at [redacted]w[redacted]d here for routine follow up. She is dated by LMP.  She reports no bleeding, no contractions, no cramping, and no leaking. She reports fetal movement. She denies vaginal bleeding, contractions, or loss of fluid. See flow sheet for details.  Vitals:   07/14/21 0920  BP: (!) 110/57  Pulse: 89    A/P: Pregnancy at [redacted]w[redacted]d.  Doing well.   Routine prenatal care  Dating reviewed, dating tab is correct Fetal heart tones Appropriate141 Fundal height within expected range. 37 Fetal position confirmed Vertex using Ultrasound .  GBS previosly collected  Repeat GC/CT previously collected  The patient does not have a history of HSV and valacyclovir is not indicated at this time.  Infant feeding choice: Formula feeding  Contraception choice: Tubal ligation but will discuss with surgeon after delivery.  Infant circumcision desired not applicable Influenza vaccine previously administered.   Tdap previously administered between 27-36 weeks  COVID vaccination was discussed and given at previous visit .  Pregnancy education regarding preterm labor, fetal movement,  benefits of breastfeeding, contraception, fetal growth, expected weight gain, and safe infant sleep were discussed.    2. Pregnancy issues include the following and were addressed as appropriate today:  Patient continues to request BTL form contraception after delivery.  Patient will notify the OB provider at the time of delivery who will hopefully be able to schedule surgery for 6 weeks postpartum. Late prenatal care initiated at 20 weeks GBS as well as GC/chlamydia negative GERD: Well-controlled on famotidine History of gestational hypertension: Patient is taking 81 mg aspirin daily Short interval pregnancy: Patient delivered in November 2021 Failed 1 hour GTT: Patient had second trimester 3-hour GTT which was within normal limits Patient  scheduled for 1 week follow-up    Problem list and pregnancy box updated: Yes.  Follow up 1 week.

## 2021-07-14 NOTE — Patient Instructions (Addendum)
It was wonderful seeing you today.  Congratulations on making it to term.  Your baby appears to be doing well and I have no concerns at this time.  If you fall and land on your stomach or have any pain I want you to be evaluated in the maternal assessment unit at the hospital.  If you have any gush of fluids, contractions, vaginal bleeding, decreased fetal movement also wants to be evaluated in the maternal assessment unit.  I have you have a wonderful afternoon!

## 2021-07-21 ENCOUNTER — Other Ambulatory Visit: Payer: Self-pay

## 2021-07-22 ENCOUNTER — Ambulatory Visit (INDEPENDENT_AMBULATORY_CARE_PROVIDER_SITE_OTHER): Payer: Self-pay | Admitting: Family Medicine

## 2021-07-22 ENCOUNTER — Other Ambulatory Visit: Payer: Self-pay

## 2021-07-22 VITALS — BP 111/81 | HR 88 | Wt 198.6 lb

## 2021-07-22 DIAGNOSIS — Z3483 Encounter for supervision of other normal pregnancy, third trimester: Secondary | ICD-10-CM

## 2021-07-22 NOTE — Patient Instructions (Signed)
It was great seeing you today.  I have no concerns regarding her pregnancy.  We have scheduled a ultrasound of your baby which will happen between 40 and 41 weeks.  You will be seen next week with our faculty OB clinic on 11/9.  If you have any vaginal bleeding, gush of fluid, feel contractions, or feel decreased fetal movement between now and then please seek medical attention at the maternal assessment unit.  I hope you have a wonderful day!

## 2021-07-22 NOTE — Progress Notes (Signed)
  [facility] Prenatal Visit  Wanda Palmer is a 25 y.o. G3P2002 at [redacted]w[redacted]d here for routine follow up. She is dated by LMP.  She reports no bleeding, no contractions, no cramping, no leaking, and acknowledges occasional dizzines . She reports fetal movement. She denies vaginal bleeding, contractions, or loss of fluid. See flow sheet for details. Reports she is nervous.   Vitals:   07/22/21 1400  BP: 111/81  Pulse: 88    A/P: Pregnancy at [redacted]w[redacted]d.  Doing well.   Routine prenatal care:  Dating reviewed, dating tab is correct Fetal heart tones Appropriate 149 Fundal height within expected range.  38 cm Fetal position confirmed Vertex using Ultrasound .  Infant feeding choice: Formula feeding  Contraception choice: Tubal ligation but will discuss with surgeon after delivery.  Infant circumcision desired not applicable Pain control in labor discussed and patient desires epidural.  Influenza vaccine previously administered.   Tdap previously administered between 27-36 weeks  GBS and gc/chlamydia testing results were reviewed today.   Pregnancy education regarding labor, fetal movement,  benefits of breastfeeding, contraception, and safe infant sleep were discussed.  Labor and fetal movement precautions reviewed. Induction of labor discussed.  Induction of labor was not scheduled today and will be scheduled at next visit. BPP scheduled between 40-41 weeks.   2. Pregnancy issues include the following and were addressed as appropriate today:  Postdates BPP scheduled for patient at this visit.  She will need scheduling for induction of labor at next visit. Patient reports intermittent episodes of dizziness.  Discussed MAU precautions regarding this.  She requested cervical exam because she reports last time she went into labor she was experiencing the same symptoms.  Cervical exam today was 3/25/-3 GBS as well as GC/chlamydia negative GERD: Well-controlled on  famotidine Late prenatal care initiated at 20 weeks History of gestational hypertension: Patient is taking 81 mg aspirin daily, blood pressures today within normal limits Failed 1 hour GTT: Patient had second trimester 3-hour GTT within normal limits  Problem list and pregnancy box updated: Yes.   Follow up 1 week.

## 2021-07-23 ENCOUNTER — Inpatient Hospital Stay (HOSPITAL_COMMUNITY)
Admission: AD | Admit: 2021-07-23 | Discharge: 2021-07-23 | Disposition: A | Discharge status: Home / Self Care | Payer: Self-pay | Attending: Obstetrics and Gynecology | Admitting: Obstetrics and Gynecology

## 2021-07-23 ENCOUNTER — Other Ambulatory Visit: Payer: Self-pay

## 2021-07-23 ENCOUNTER — Encounter (HOSPITAL_COMMUNITY): Payer: Self-pay | Admitting: Obstetrics and Gynecology

## 2021-07-23 DIAGNOSIS — O99013 Anemia complicating pregnancy, third trimester: Secondary | ICD-10-CM | POA: Insufficient documentation

## 2021-07-23 DIAGNOSIS — D649 Anemia, unspecified: Secondary | ICD-10-CM

## 2021-07-23 DIAGNOSIS — Z7982 Long term (current) use of aspirin: Secondary | ICD-10-CM | POA: Insufficient documentation

## 2021-07-23 DIAGNOSIS — O99283 Endocrine, nutritional and metabolic diseases complicating pregnancy, third trimester: Secondary | ICD-10-CM | POA: Insufficient documentation

## 2021-07-23 DIAGNOSIS — Z3A38 38 weeks gestation of pregnancy: Secondary | ICD-10-CM | POA: Insufficient documentation

## 2021-07-23 DIAGNOSIS — E860 Dehydration: Secondary | ICD-10-CM | POA: Insufficient documentation

## 2021-07-23 DIAGNOSIS — O99119 Other diseases of the blood and blood-forming organs and certain disorders involving the immune mechanism complicating pregnancy, unspecified trimester: Secondary | ICD-10-CM

## 2021-07-23 DIAGNOSIS — O99113 Other diseases of the blood and blood-forming organs and certain disorders involving the immune mechanism complicating pregnancy, third trimester: Secondary | ICD-10-CM | POA: Insufficient documentation

## 2021-07-23 DIAGNOSIS — D696 Thrombocytopenia, unspecified: Secondary | ICD-10-CM | POA: Insufficient documentation

## 2021-07-23 DIAGNOSIS — Z3689 Encounter for other specified antenatal screening: Secondary | ICD-10-CM

## 2021-07-23 DIAGNOSIS — O26893 Other specified pregnancy related conditions, third trimester: Secondary | ICD-10-CM | POA: Insufficient documentation

## 2021-07-23 LAB — CBC
HCT: 31.8 % — ABNORMAL LOW (ref 36.0–46.0)
Hemoglobin: 10.7 g/dL — ABNORMAL LOW (ref 12.0–15.0)
MCH: 31.4 pg (ref 26.0–34.0)
MCHC: 33.6 g/dL (ref 30.0–36.0)
MCV: 93.3 fL (ref 80.0–100.0)
Platelets: 136 10*3/uL — ABNORMAL LOW (ref 150–400)
RBC: 3.41 MIL/uL — ABNORMAL LOW (ref 3.87–5.11)
RDW: 12.8 % (ref 11.5–15.5)
WBC: 6.8 10*3/uL (ref 4.0–10.5)
nRBC: 0 % (ref 0.0–0.2)

## 2021-07-23 LAB — URINALYSIS, ROUTINE W REFLEX MICROSCOPIC
Bilirubin Urine: NEGATIVE
Glucose, UA: NEGATIVE mg/dL
Ketones, ur: NEGATIVE mg/dL
Nitrite: NEGATIVE
Protein, ur: 30 mg/dL — AB
Specific Gravity, Urine: 1.025 (ref 1.005–1.030)
WBC, UA: >50 WBC/hpf — ABNORMAL HIGH (ref 0–5)
pH: 6 (ref 5.0–8.0)

## 2021-07-23 NOTE — MAU Note (Signed)
Presents stating she doesn't feel right, been dizzy all day.  Pt denies ctxs, endorses lower back pain & pelvic pressure.  Reports had visit yesterday and cervix 3 cms & noticed mucus discharge after VE.  Endorses +FM.  Denies LOF.

## 2021-07-23 NOTE — MAU Provider Note (Signed)
History     CSN: [id number]  Arrival date and time: 07/23/21 1554   Event Date/Time   First Provider Initiated Contact with Patient 07/23/21 1643      Chief Complaint  Patient presents with   Dizziness   25 y.o. [id number] @38 .3 wks presenting with feeling faint. Reports onset last week but became worse today. No syncope. States she is eating and drinking well, had 3 bottles of water today along with milk. Reports CP and SOB when she has near fainting episodes. Reports good FM. Denies VB, LOF, ctx. No pregnancy complaints.  OB History     Gravida  3   Para  2   Term  2   Preterm  0   AB  0   Living  2      SAB  0   IAB  0   Ectopic  0   Multiple  0   Live Births  2           Past Medical History:  Diagnosis Date   Medical history non-contributory     Past Surgical History:  Procedure Laterality Date   NO PAST SURGERIES      Family History  Problem Relation Age of Onset   Learning disabilities Father     Social History   Tobacco Use   Smoking status: Never   Smokeless tobacco: Never  Vaping Use   Vaping Use: Never used  Substance Use Topics   Alcohol use: Not Currently   Drug use: Not Currently    Allergies:  Allergies  Allergen Reactions   Pork-Derived Products     Medications Prior to Admission  Medication Sig Dispense Refill Last Dose   acetaminophen (TYLENOL) 325 MG tablet Take 2 tablets (650 mg total) by mouth every 4 (four) hours as needed (for pain scale < 4).      aspirin EC 81 MG tablet Take 81 mg by mouth daily. Swallow whole.      famotidine (PEPCID) 20 MG tablet Take 1 tablet (20 mg total) by mouth daily. 30 tablet 0    Prenatal Vit-Fe Fumarate-FA (PRENATAL VITAMIN PO) Take by mouth.       Review of Systems  Constitutional:  Negative for chills and fever.  Respiratory:  Positive for shortness of breath.   Cardiovascular:  Positive for chest pain.  Gastrointestinal:  Negative for abdominal pain.  Genitourinary:   Negative for vaginal bleeding and vaginal discharge.  Neurological:  Positive for light-headedness. Negative for syncope and headaches.  Physical Exam   Blood pressure 122/69, pulse 95, temperature 98.1 F (36.7 C), temperature source Oral, resp. rate 17, height 5\' 3"  (1.6 m), weight 90.1 kg, last menstrual period 10/27/2020, SpO2 100 %, unknown if currently breastfeeding. Orthostatic VS for the past 24 hrs:  BP- Lying Pulse- Lying BP- Sitting Pulse- Sitting BP- Standing at 0 minutes Pulse- Standing at 0 minutes  07/23/21 1657 122/59 87 112/58 105 116/66 94   Physical Exam Constitutional:      General: She is not in acute distress.    Appearance: Normal appearance.  HENT:     Head: Normocephalic and atraumatic.  Cardiovascular:     Rate and Rhythm: Normal rate and regular rhythm.     Pulses: Normal pulses.     Heart sounds: Normal heart sounds. No murmur heard. Pulmonary:     Effort: Pulmonary effort is normal. No respiratory distress.     Breath sounds: Normal breath sounds. No stridor. No wheezing, rhonchi or  rales.  Musculoskeletal:     Cervical back: Normal range of motion.  Skin:    General: Skin is warm and dry.  Neurological:     General: No focal deficit present.     Mental Status: She is alert and oriented to person, place, and time.  Psychiatric:        Mood and Affect: Mood normal.        Behavior: Behavior normal.  EFM: 150 bpm, mod variability, + accels, no decels Toco: rare  Results for orders placed or performed during the hospital encounter of 07/23/21 (from the past 24 hour(s))  CBC     Status: Abnormal   Collection Time: 07/23/21  4:52 PM  Result Value Ref Range   WBC 6.8 4.0 - 10.5 K/uL   RBC 3.41 (L) 3.87 - 5.11 MIL/uL   Hemoglobin 10.7 (L) 12.0 - 15.0 g/dL   HCT 45.4 (L) 09.8 - 11.9 %   MCV 93.3 80.0 - 100.0 fL   MCH 31.4 26.0 - 34.0 pg   MCHC 33.6 30.0 - 36.0 g/dL   RDW 14.7 82.9 - 56.2 %   Platelets 136 (L) 150 - 400 K/uL   nRBC 0.0 0.0 - 0.2 %   Urinalysis, Routine w reflex microscopic Urine, Clean Catch     Status: Abnormal   Collection Time: 07/23/21  5:18 PM  Result Value Ref Range   Color, Urine YELLOW YELLOW   APPearance CLOUDY (A) CLEAR   Specific Gravity, Urine 1.025 1.005 - 1.030   pH 6.0 5.0 - 8.0   Glucose, UA NEGATIVE NEGATIVE mg/dL   Hgb urine dipstick MODERATE (A) NEGATIVE   Bilirubin Urine NEGATIVE NEGATIVE   Ketones, ur NEGATIVE NEGATIVE mg/dL   Protein, ur 30 (A) NEGATIVE mg/dL   Nitrite NEGATIVE NEGATIVE   Leukocytes,Ua LARGE (A) NEGATIVE   RBC / HPF 21-50 0 - 5 RBC/hpf   WBC, UA >50 (H) 0 - 5 WBC/hpf   Bacteria, UA RARE (A) NONE SEEN   Squamous Epithelial / LPF 6-10 0 - 5   Mucus PRESENT    MAU Course  Procedures  MDM Labs and EKG ordered and reviewed. EKG normal, read by Dr. Lysle Morales with cards. Not orthostatic. Mild anemia and dehydration noted, likely etiology of sx. Stable for discharge home.   Assessment and Plan   1. [redacted] weeks gestation of pregnancy   2. NST (non-stress test) reactive   3. Anemia during pregnancy in third trimester   4. Thrombocytopenia affecting pregnancy (HCC)   5. Dehydration    Discharge home Follow up at [facility] as scheduled Return precautions  Allergies as of 07/23/2021       Reactions   Pork-derived Products         Medication List     TAKE these medications    acetaminophen 325 MG tablet Commonly known as: Tylenol Take 2 tablets (650 mg total) by mouth every 4 (four) hours as needed (for pain scale < 4).   aspirin EC 81 MG tablet Take 81 mg by mouth daily. Swallow whole.   famotidine 20 MG tablet Commonly known as: Pepcid Take 1 tablet (20 mg total) by mouth daily.   PRENATAL VITAMIN PO Take by mouth.        Spanish interpreter present for all interactions Donette Larry, CNM 07/23/2021, 6:42 PM

## 2021-07-25 ENCOUNTER — Telehealth: Payer: Self-pay | Admitting: Certified Nurse Midwife

## 2021-07-25 DIAGNOSIS — O2343 Unspecified infection of urinary tract in pregnancy, third trimester: Secondary | ICD-10-CM

## 2021-07-25 MED ORDER — CEFADROXIL 500 MG PO CAPS: 500.0000 mg | ORAL_CAPSULE | Freq: Two times a day (BID) | ORAL | 0 refills | Status: DC

## 2021-07-25 NOTE — Telephone Encounter (Signed)
Notified of UTI and need for abx via Spanish interpreter [place]. Rx Duricef.

## 2021-07-27 ENCOUNTER — Ambulatory Visit (INDEPENDENT_AMBULATORY_CARE_PROVIDER_SITE_OTHER): Payer: Self-pay | Admitting: Family Medicine

## 2021-07-27 ENCOUNTER — Other Ambulatory Visit: Payer: Self-pay

## 2021-07-27 VITALS — BP 117/76 | HR 113 | Wt 202.0 lb

## 2021-07-27 DIAGNOSIS — Z3483 Encounter for supervision of other normal pregnancy, third trimester: Secondary | ICD-10-CM

## 2021-07-27 DIAGNOSIS — H8110 Benign paroxysmal vertigo, unspecified ear: Secondary | ICD-10-CM

## 2021-07-27 LAB — CULTURE, OB URINE: Culture: >=100000 — AB

## 2021-07-27 MED ORDER — MECLIZINE HCL 12.5 MG PO TABS: 12.5000 mg | ORAL_TABLET | Freq: Three times a day (TID) | ORAL | 0 refills | Status: DC | PRN

## 2021-07-27 NOTE — Patient Instructions (Signed)
Go to the MAU at [facility] at [facility] if: You begin to have strong, frequent contractions Your water breaks.  Sometimes it is a big gush of fluid, sometimes it is just a trickle that keeps getting your underwear wet or running down your legs You have vaginal bleeding.  It is normal to have a small amount of spotting if your cervix was checked.  You do not feel your baby moving like normal.  If you do not, get something to eat and drink and lay down and focus on feeling your baby move.   If your baby is still not moving like normal, you should go to MAU.   Vrtigo posicional benigno Benign Positional Vertigo El vrtigo es la sensacin de que usted o todo lo que lo rodea se mueve cuando en realidad eso no sucede. El vrtigo posicional benigno es el tipo de vrtigo ms comn. Generalmente se trata de una enfermedad no daina (benigna). Esta afeccin es posicional. Esto significa que los sntomas son desencadenados por ciertos movimientos y posiciones. Esta afeccin puede ser peligrosa si ocurre mientras est haciendo algo que podra suponer un riesgo para usted o para los dems. Incluye actividades como conducir u operar [place]. Cules son las causas? El odo interno tiene canales llenos de lquido que ayudan a que el cerebro perciba el movimiento y el equilibrio. Cuando el lquido se mueve, el cerebro recibe mensajes sobre la posicin del cuerpo. En el vrtigo posicional benigno, los cristales de calcio que estn en el odo interno se desprenden y alteran la zona del odo interno. Esto hace que el cerebro reciba mensajes confusos sobre la posicin del cuerpo. Qu incrementa el riesgo? Es ms probable que sufra esta afeccin si: Es mujer. Es mayor de 50 aos. Ha sufrido una lesin en la cabeza recientemente. Tiene una enfermedad en el odo interno. Cules son los signos o sntomas? Generalmente, los sntomas de este trastorno se presentan al mover la cabeza o los ojos en  diferentes direcciones. Los sntomas pueden aparecer repentinamente y suelen durar menos de un minuto. Incluyen los siguientes: Prdida del equilibrio y cadas. Sensacin de estar dando vueltas o movindose. Sensacin de que el entorno est dando vueltas o movindose. Nuseas y vmitos. Visin borrosa. Mareos. Movimientos oculares involuntarios (nistagmo). Los sntomas pueden ser leves y solo causar problemas menores, o pueden ser graves e interferir en la vida cotidiana. Los episodios de vrtigo posicional benigno pueden repetirse (volver a Research officer, trade union) con el transcurso del tiempo. Los sntomas tambin pueden mejorar con [employer]. Cmo se diagnostica? Esta afeccin se puede diagnosticar en funcin de lo siguiente: Sus antecedentes mdicos. Un examen fsico de la cabeza, el cuello y los odos. Pruebas de posicin para detectar o estimular el vrtigo. Es posible que le pidan que gire la cabeza y [place] de posicin, como pasar de estar sentado a Teacher, music. El mdico estar pendiente por si aparecen sntomas de vrtigo. Tal vez lo deriven a Catering manager en problemas de la garganta, la nariz y el odo (otorrinolaringlogo), o a uno que se especializa en trastornos del sistema nervioso (neurlogo). Cmo se trata? Esta afeccin se puede tratar en una sesin en la cual el mdico le pondr la cabeza en posiciones especficas para ayudar a que los cristales desplazados del odo interno se [place]. El tratamiento de esta afeccin puede llevar varias sesiones. [employer] casos son graves, tal vez haya que realizar una ciruga, pero esto no es frecuente. En algunos casos, el vrtigo posicional  benigno se resuelve por s solo en el trmino de 2 o 4 semanas. Siga estas instrucciones en su casa: Seguridad Muvase lentamente. Evite algunas posiciones o determinados movimientos repentinos de la cabeza y el cuerpo como se lo haya indicado el mdico. Evite conducir y Restaurant manager, fast food maquinaria hasta que el mdico  le indique que es seguro [place]. No haga ninguna tarea que podra ser peligrosa para usted o para Economist en caso de vrtigo. Si tiene dificultad para caminar o mantener el equilibrio, use un bastn para Photographer estabilidad. Si se siente mareado o inestable, sintese de inmediato. Retome sus actividades normales segn lo indicado por el mdico. Pregntele al mdico qu actividades son seguras para usted. Instrucciones generales Use los medicamentos de venta libre y los recetados solamente como se lo haya indicado el mdico. Beba suficiente lquido como para Pharmacologist la orina de color amarillo plido. Concurra a todas las visitas de seguimiento. Esto es importante. Comunquese con un mdico si: Tiene fiebre. Su afeccin empeora o presenta sntomas nuevos. Sus familiares o amigos advierten cambios en su comportamiento. Tiene nuseas o vmitos que empeoran. Tiene adormecimiento o sensacin de pinchazos y hormigueo. Busque ayuda de inmediato si: Tiene dificultad para hablar o para moverse. Siempre est mareado o se desmaya. Presenta dolores de cabeza intensos. Tiene debilidad en los brazos o las piernas. Presenta cambios en la audicin o la visin. Presenta rigidez en el cuello. Presenta sensibilidad a la luz. Estos sntomas pueden representar un problema grave que constituye Radio broadcast assistant. No espere a ver si los sntomas desaparecen. Solicite atencin mdica de inmediato. Comunquese con el servicio de emergencias de su localidad (911 en los [place]). No conduzca por sus propios medios [employer]. Resumen El vrtigo es la sensacin de que usted o todo lo que lo rodea se mueve cuando en realidad eso no sucede. El vrtigo posicional benigno es el tipo de vrtigo ms comn. La causa de esta afeccin es el desplazamiento de los cristales de calcio del odo interno. Esto produce una alteracin en una zona del odo interno que ayuda al cerebro a percibir el movimiento y el  equilibrio. Los sntomas incluyen prdida del equilibrio y cadas, sensacin de que usted o su entorno se mueve, nuseas y vmitos, y visin borrosa. Esta afeccin se puede diagnosticar en funcin de los sntomas, un examen fsico y pruebas de posicionamiento. Siga las instrucciones de seguridad que le haya dado el mdico y vaya a todas las visitas de seguimiento. Esto es importante. Esta informacin no tiene Theme park manager el consejo del mdico. Asegrese de hacerle al mdico cualquier pregunta que tenga. Document Revised: 08/30/2020 Document Reviewed: 08/30/2020 Elsevier Patient Education  2022 [employer].

## 2021-07-28 ENCOUNTER — Ambulatory Visit: Payer: Self-pay

## 2021-08-02 ENCOUNTER — Other Ambulatory Visit: Payer: Self-pay | Admitting: Advanced Practice Midwife

## 2021-08-02 NOTE — Progress Notes (Signed)
[facility] Faculty OB Visit  Wanda Palmer Wanda Palmer is a 25 y.o. G3P2002 at [redacted]w[redacted]d (via LMP = 23w u/s) who presents for routine follow up.   Denies cramping/ctx, fluid leaking, vaginal bleeding, or decreased fetal movement. Taking PNV. Recently stopped taking baby aspirin.   Primary Prenatal Care Provider: Espinoza/Cresenzo  Primary complaint today is dizziness. Has feeling of room spinning. Seen in MAU and had EKG done which was read as normal. Worse with moving her head in certain directions. Earlier today felt shaky while eating breakfast. Generally notes that dizziness improves with more eating/drinking. She is staying well hydrated. Denies chest pain.   MAU visit also noted UTI, treated with cefadroxil. Patient got antibiotics and has been taking them.  BP today 117/76 FHR: 145 bpm Uterine size: 39cm Neuro exam: cranial nerves II-XII tested and intact. Speech normal. Full strength bilat upper and lower ext. Normal FNF.   Assessment & Plan  1. Routine prenatal care: - received dose #1 of COVID vaccine, had appointment for tomorrow for second dose but patient requested this be canceled as she did not want to come in again tomorrow. Unfortunately is not eligible for dose #2 until tomorrow (must be spaced by 21days). - has post dates BPP scheduled for 11/16 ([redacted]w[redacted]d) - will go ahead and schedule for IOL at [redacted]w[redacted]d  2. BPPV - suspect dizziness (described as spinning) is related to BPPV. Normal neuro exam today. Will rx low dose meclizine, confirmed safe in pregnancy. Follow up if not improving.  3. Short interval pregnancy - encourage birth control postpartum  4. Late to care at 20w  Next prenatal visit in 1 week. Labor & fetal movement precautions discussed.  Levert Feinstein, MD [facility] Faculty

## 2021-08-03 ENCOUNTER — Ambulatory Visit: Payer: Self-pay | Attending: Family Medicine

## 2021-08-03 ENCOUNTER — Ambulatory Visit: Payer: Self-pay

## 2021-08-03 ENCOUNTER — Other Ambulatory Visit: Payer: Self-pay

## 2021-08-03 DIAGNOSIS — Z3A40 40 weeks gestation of pregnancy: Secondary | ICD-10-CM | POA: Insufficient documentation

## 2021-08-03 DIAGNOSIS — Z3483 Encounter for supervision of other normal pregnancy, third trimester: Secondary | ICD-10-CM

## 2021-08-03 DIAGNOSIS — O09893 Supervision of other high risk pregnancies, third trimester: Secondary | ICD-10-CM | POA: Insufficient documentation

## 2021-08-03 DIAGNOSIS — O480 Post-term pregnancy: Secondary | ICD-10-CM | POA: Insufficient documentation

## 2021-08-03 DIAGNOSIS — O0933 Supervision of pregnancy with insufficient antenatal care, third trimester: Secondary | ICD-10-CM | POA: Insufficient documentation

## 2021-08-04 ENCOUNTER — Other Ambulatory Visit: Payer: Self-pay | Admitting: Advanced Practice Midwife

## 2021-08-05 ENCOUNTER — Inpatient Hospital Stay (HOSPITAL_COMMUNITY)
Admission: AD | Admit: 2021-08-05 | Discharge: 2021-08-07 | DRG: 807 | Disposition: A | Discharge status: Home / Self Care | Payer: Medicaid Other | Attending: Obstetrics and Gynecology | Admitting: Obstetrics and Gynecology

## 2021-08-05 ENCOUNTER — Encounter (HOSPITAL_COMMUNITY): Payer: Self-pay | Admitting: Obstetrics & Gynecology

## 2021-08-05 ENCOUNTER — Ambulatory Visit: Payer: Self-pay | Admitting: Family Medicine

## 2021-08-05 ENCOUNTER — Other Ambulatory Visit: Payer: Self-pay

## 2021-08-05 ENCOUNTER — Inpatient Hospital Stay (HOSPITAL_COMMUNITY): Payer: Medicaid Other | Admitting: Anesthesiology

## 2021-08-05 DIAGNOSIS — O480 Post-term pregnancy: Secondary | ICD-10-CM | POA: Diagnosis present

## 2021-08-05 DIAGNOSIS — Z3490 Encounter for supervision of normal pregnancy, unspecified, unspecified trimester: Secondary | ICD-10-CM

## 2021-08-05 DIAGNOSIS — O09899 Supervision of other high risk pregnancies, unspecified trimester: Secondary | ICD-10-CM

## 2021-08-05 DIAGNOSIS — Z7982 Long term (current) use of aspirin: Secondary | ICD-10-CM

## 2021-08-05 DIAGNOSIS — Z20822 Contact with and (suspected) exposure to covid-19: Secondary | ICD-10-CM | POA: Diagnosis present

## 2021-08-05 DIAGNOSIS — Z789 Other specified health status: Secondary | ICD-10-CM | POA: Diagnosis present

## 2021-08-05 DIAGNOSIS — O26893 Other specified pregnancy related conditions, third trimester: Secondary | ICD-10-CM | POA: Diagnosis present

## 2021-08-05 DIAGNOSIS — Z758 Other problems related to medical facilities and other health care: Secondary | ICD-10-CM | POA: Diagnosis present

## 2021-08-05 DIAGNOSIS — Z3A40 40 weeks gestation of pregnancy: Secondary | ICD-10-CM

## 2021-08-05 DIAGNOSIS — Z8759 Personal history of other complications of pregnancy, childbirth and the puerperium: Secondary | ICD-10-CM

## 2021-08-05 LAB — CBC
HCT: 32.5 % — ABNORMAL LOW (ref 36.0–46.0)
Hemoglobin: 11.2 g/dL — ABNORMAL LOW (ref 12.0–15.0)
MCH: 32.1 pg (ref 26.0–34.0)
MCHC: 34.5 g/dL (ref 30.0–36.0)
MCV: 93.1 fL (ref 80.0–100.0)
Platelets: 134 10*3/uL — ABNORMAL LOW (ref 150–400)
RBC: 3.49 MIL/uL — ABNORMAL LOW (ref 3.87–5.11)
RDW: 12.9 % (ref 11.5–15.5)
WBC: 7.6 10*3/uL (ref 4.0–10.5)
nRBC: 0 % (ref 0.0–0.2)

## 2021-08-05 LAB — TYPE AND SCREEN
ABO/RH(D): B POS
Antibody Screen: NEGATIVE

## 2021-08-05 LAB — RESP PANEL BY RT-PCR (FLU A&B, COVID) ARPGX2
Influenza A by PCR: NEGATIVE
Influenza B by PCR: NEGATIVE
SARS Coronavirus 2 by RT PCR: NEGATIVE

## 2021-08-05 MED ORDER — DIPHENHYDRAMINE HCL 50 MG/ML IJ SOLN: 12.5000 mg | INTRAMUSCULAR | Status: DC | PRN

## 2021-08-05 MED ORDER — SIMETHICONE 80 MG PO CHEW: 80.0000 mg | CHEWABLE_TABLET | ORAL | Status: DC | PRN

## 2021-08-05 MED ORDER — FENTANYL-BUPIVACAINE-NACL 0.5-0.125-0.9 MG/250ML-% EP SOLN
12.0000 mL/h | EPIDURAL | Status: DC | PRN
  Administered 2021-08-05: 12 mL/h via EPIDURAL
  Filled 2021-08-05: qty 250

## 2021-08-05 MED ORDER — LACTATED RINGERS IV SOLN
500.0000 mL | INTRAVENOUS | Status: DC | PRN
Start: 2021-08-05 — End: 2021-08-05

## 2021-08-05 MED ORDER — LIDOCAINE-EPINEPHRINE (PF) 2 %-1:200000 IJ SOLN
INTRAMUSCULAR | Status: DC | PRN
  Administered 2021-08-05: 5 mL via EPIDURAL

## 2021-08-05 MED ORDER — OXYTOCIN-SODIUM CHLORIDE 30-0.9 UT/500ML-% IV SOLN
1.0000 m[IU]/min | INTRAVENOUS | Status: DC
  Administered 2021-08-05: 2 m[IU]/min via INTRAVENOUS
  Filled 2021-08-05: qty 500

## 2021-08-05 MED ORDER — WITCH HAZEL-GLYCERIN EX PADS: 1.0000 "application " | MEDICATED_PAD | CUTANEOUS | Status: DC | PRN

## 2021-08-05 MED ORDER — EPHEDRINE 5 MG/ML INJ: 10.0000 mg | INTRAVENOUS | Status: DC | PRN

## 2021-08-05 MED ORDER — ZOLPIDEM TARTRATE 5 MG PO TABS: 5.0000 mg | ORAL_TABLET | Freq: Every evening | ORAL | Status: DC | PRN

## 2021-08-05 MED ORDER — SOD CITRATE-CITRIC ACID 500-334 MG/5ML PO SOLN: 30.0000 mL | ORAL | Status: DC | PRN

## 2021-08-05 MED ORDER — PHENYLEPHRINE 40 MCG/ML (10ML) SYRINGE FOR IV PUSH (FOR BLOOD PRESSURE SUPPORT): 80.0000 ug | PREFILLED_SYRINGE | INTRAVENOUS | Status: DC | PRN

## 2021-08-05 MED ORDER — TETANUS-DIPHTH-ACELL PERTUSSIS 5-2.5-18.5 LF-MCG/0.5 IM SUSY: 0.5000 mL | PREFILLED_SYRINGE | Freq: Once | INTRAMUSCULAR | Status: DC

## 2021-08-05 MED ORDER — OXYTOCIN-SODIUM CHLORIDE 30-0.9 UT/500ML-% IV SOLN: 2.5000 [IU]/h | INTRAVENOUS | Status: DC

## 2021-08-05 MED ORDER — DIBUCAINE (PERIANAL) 1 % EX OINT: 1.0000 "application " | TOPICAL_OINTMENT | CUTANEOUS | Status: DC | PRN

## 2021-08-05 MED ORDER — OXYCODONE-ACETAMINOPHEN 5-325 MG PO TABS: 1.0000 | ORAL_TABLET | ORAL | Status: DC | PRN

## 2021-08-05 MED ORDER — LACTATED RINGERS IV SOLN: INTRAVENOUS | Status: DC

## 2021-08-05 MED ORDER — IBUPROFEN 600 MG PO TABS
600.0000 mg | ORAL_TABLET | Freq: Four times a day (QID) | ORAL | Status: DC
  Administered 2021-08-06 – 2021-08-07 (×6): 600 mg via ORAL
  Filled 2021-08-05 (×6): qty 1

## 2021-08-05 MED ORDER — DIPHENHYDRAMINE HCL 25 MG PO CAPS: 25.0000 mg | ORAL_CAPSULE | Freq: Four times a day (QID) | ORAL | Status: DC | PRN

## 2021-08-05 MED ORDER — SENNOSIDES-DOCUSATE SODIUM 8.6-50 MG PO TABS
2.0000 | ORAL_TABLET | ORAL | Status: DC
  Administered 2021-08-07: 2 via ORAL
  Filled 2021-08-05: qty 2

## 2021-08-05 MED ORDER — OXYCODONE-ACETAMINOPHEN 5-325 MG PO TABS: 2.0000 | ORAL_TABLET | ORAL | Status: DC | PRN

## 2021-08-05 MED ORDER — ONDANSETRON HCL 4 MG PO TABS: 4.0000 mg | ORAL_TABLET | ORAL | Status: DC | PRN

## 2021-08-05 MED ORDER — ONDANSETRON HCL 4 MG/2ML IJ SOLN: 4.0000 mg | INTRAMUSCULAR | Status: DC | PRN

## 2021-08-05 MED ORDER — LIDOCAINE HCL (PF) 1 % IJ SOLN: 30.0000 mL | INTRAMUSCULAR | Status: DC | PRN

## 2021-08-05 MED ORDER — ACETAMINOPHEN 325 MG PO TABS: 650.0000 mg | ORAL_TABLET | ORAL | Status: DC | PRN

## 2021-08-05 MED ORDER — ONDANSETRON HCL 4 MG/2ML IJ SOLN: 4.0000 mg | Freq: Four times a day (QID) | INTRAMUSCULAR | Status: DC | PRN

## 2021-08-05 MED ORDER — COCONUT OIL OIL: 1.0000 "application " | TOPICAL_OIL | Status: DC | PRN

## 2021-08-05 MED ORDER — PRENATAL MULTIVITAMIN CH
1.0000 | ORAL_TABLET | Freq: Every day | ORAL | Status: DC
  Administered 2021-08-06 – 2021-08-07 (×2): 1 via ORAL
  Filled 2021-08-05 (×2): qty 1

## 2021-08-05 MED ORDER — BENZOCAINE-MENTHOL 20-0.5 % EX AERO: 1.0000 "application " | INHALATION_SPRAY | CUTANEOUS | Status: DC | PRN

## 2021-08-05 MED ORDER — TERBUTALINE SULFATE 1 MG/ML IJ SOLN: 0.2500 mg | Freq: Once | INTRAMUSCULAR | Status: DC | PRN

## 2021-08-05 MED ORDER — LACTATED RINGERS IV SOLN
500.0000 mL | Freq: Once | INTRAVENOUS | Status: AC
  Administered 2021-08-05: 500 mL via INTRAVENOUS

## 2021-08-05 MED ORDER — OXYTOCIN BOLUS FROM INFUSION
333.0000 mL | Freq: Once | INTRAVENOUS | Status: AC
  Administered 2021-08-05: 333 mL via INTRAVENOUS

## 2021-08-05 MED ORDER — FENTANYL CITRATE (PF) 100 MCG/2ML IJ SOLN: 100.0000 ug | INTRAMUSCULAR | Status: DC | PRN

## 2021-08-05 MED ORDER — ACETAMINOPHEN 325 MG PO TABS
650.0000 mg | ORAL_TABLET | ORAL | Status: DC | PRN
  Administered 2021-08-06 (×2): 650 mg via ORAL
  Filled 2021-08-05 (×2): qty 2

## 2021-08-05 NOTE — Lactation Note (Signed)
This note was copied from a baby's chart. Lactation Consultation Note  Patient Name: Wanda Palmer Today's Date: 08/05/2021   Age:25 hours RN Corrie Dandy) L&D, mom would like to be seen by [facility] services on [facility] but not L&D. Maternal Data    Feeding    LATCH Score              Lactation Tools Discussed/Used    Interventions    Discharge    Consult Status      Danelle Earthly 08/05/2021, 11:35 PM

## 2021-08-05 NOTE — Progress Notes (Signed)
Labor Progress Note Wanda Palmer is a 25 y.o. G3P2002 at [redacted]w[redacted]d presented for IOL for postdates after presenting to the MAU with contractions and initially a nonreactive NST.  S: Patient was resting comfortably in bed after having epidural placed.  O:  BP (!) 109/38   Pulse 75   Temp 97.7 F (36.5 C) (Oral)   Resp 16   Ht 5\' 2"  (1.575 m)   Wt 91.6 kg   LMP 10/27/2020   SpO2 98%   BMI 36.95 kg/m  EFM: 135 bpm /moderate variability /accelerations present with variable deceleration which recovered back to baseline  CVE: Dilation: 5.5 Effacement (%): 80 Cervical Position: Posterior Station: -3 Presentation: Vertex Exam by:: Dr. [email or web address]   A&P: 25 y.o. 25 [redacted]w[redacted]d admitted for IOL for postdates. #Labor: No significant change from earlier check. AROM with scant clear fluid return. Plan to start pitocin later if no change at next check or no improvement in contraction pattern on tocometer. #Pain: Epidural #FWB: Category II, implementing positional changes as needed #GBS negative  [redacted]w[redacted]d, MD 6:52 PM

## 2021-08-05 NOTE — H&P (Addendum)
OBSTETRIC ADMISSION HISTORY AND PHYSICAL  Wanda Palmer is a 25 y.o. female [phone] with IUP at [redacted]w[redacted]d by LMP presenting for SOL. She reports +FMs, no LOF, no blurry vision, headaches, peripheral edema, or RUQ pain. She was reporting some vaginal bleeding but was found to have non-concerning level of bleeding with bloody show. She plans on bottle feeding. She requested BTL for birth control.  She received her prenatal care at  [facility].    Dating: By LMP --->  Estimated Date of Delivery: 08/03/21  Sono:   @[redacted]w[redacted]d , CWD, normal anatomy, cephalic presentation, posterior placenta, 650g, 82.5% EFW  Prenatal History/Complications:   - Failed early 1 hr GTT but 3 hr GTT was normal  - Short interval pregnancy (last delivery 11/21)  - Late to care at 20wks  - BPPV  - Hx gHTN  Past Medical History: Past Medical History:  Diagnosis Date   Medical history non-contributory     Past Surgical History: Past Surgical History:  Procedure Laterality Date   NO PAST SURGERIES      Obstetrical History: OB History     Gravida  3   Para  2   Term  2   Preterm  0   AB  0   Living  2      SAB  0   IAB  0   Ectopic  0   Multiple  0   Live Births  2           Social History Social History   Socioeconomic History   Marital status: Single    Spouse name: Not on file   Number of children: Not on file   Years of education: Not on file   Highest education level: Not on file  Occupational History   Not on file  Tobacco Use   Smoking status: Never   Smokeless tobacco: Never  Vaping Use   Vaping Use: Never used  Substance and Sexual Activity   Alcohol use: Not Currently   Drug use: Not Currently   Sexual activity: Yes    Birth control/protection: None  Other Topics Concern   Not on file  Social History Narrative   ** Merged History Encounter **       Social Determinants of Health   Financial Resource Strain: Not on file  Food Insecurity: Not on file   Transportation Needs: Not on file  Physical Activity: Not on file  Stress: Not on file  Social Connections: Not on file    Family History: Family History  Problem Relation Age of Onset   Learning disabilities Father     Allergies: Allergies  Allergen Reactions   Pork-Derived Products Palpitations    Medications Prior to Admission  Medication Sig Dispense Refill Last Dose   acetaminophen (TYLENOL) 325 MG tablet Take 2 tablets (650 mg total) by mouth every 4 (four) hours as needed (for pain scale < 4).   Past Month   aspirin EC 81 MG tablet Take 81 mg by mouth daily. Swallow whole.   08/04/2021   Prenatal Vit-Fe Fumarate-FA (PRENATAL VITAMIN PO) Take by mouth.   08/04/2021   cefadroxil (DURICEF) 500 MG capsule Take 1 capsule (500 mg total) by mouth 2 (two) times daily. 14 capsule 0    famotidine (PEPCID) 20 MG tablet Take 1 tablet (20 mg total) by mouth daily. 30 tablet 0    meclizine (ANTIVERT) 12.5 MG tablet Take 1 tablet (12.5 mg total) by mouth 3 (three) times daily as needed  for dizziness. 15 tablet 0 Unknown     Review of Systems  All systems reviewed and negative except as stated in HPI  Blood pressure 123/65, pulse 67, temperature 97.7 F (36.5 C), temperature source Oral, resp. rate 16, height 5\' 2"  (1.575 m), weight 91.6 kg, last menstrual period 10/27/2020, SpO2 100 %, unknown if currently breastfeeding.  General appearance: alert, cooperative, and no distress Lungs: normal work of breathing on room air   Heart: normal rate, warm and well perfused  Abdomen: soft, non-tender, gravid  Pelvic: normal external genitalia Extremities: no LE edema or calf tenderness to palpation   Presentation: Cephalic Fetal monitoring: Baseline: 125 bpm, Variability: Good {> 6 bpm), Accelerations: Reactive, and Decelerations: Absent Uterine activity: Every 5-10 minutes Dilation: 6 Effacement (%): 80 Station: -2 Exam by:: [email or web address], MD   Prenatal labs: ABO, Rh: --/--/B POS  (11/18 1040) Antibody: NEG (11/18 1040) Rubella: 1.76 (06/24 1007) RPR: Non Reactive (08/30 1457)  HBsAg: Negative (06/24 1007)  HIV: Non Reactive (08/30 1457)  GBS: Negative/-- (10/20 1701)  1 hr Glucola failed; passed 3 hr GTT Genetic screening - Not done Anatomy 01-07-1972 normal  Prenatal Transfer Tool  Maternal Diabetes: No Genetic Screening: Not done Maternal Ultrasounds/Referrals: Normal Fetal Ultrasounds or other Referrals:  None Maternal Substance Abuse:  No Significant Maternal Medications:  None Significant Maternal Lab Results: Group B Strep negative  Results for orders placed or performed during the hospital encounter of 08/05/21 (from the past 24 hour(s))  Type and screen   Collection Time: 08/05/21 10:40 AM  Result Value Ref Range   ABO/RH(D) B POS    Antibody Screen NEG    Sample Expiration      08/08/2021,2359 Performed at [facility], [address], [place], [place]   Resp Panel by RT-PCR (Flu A&B, Covid) Nasopharyngeal Swab   Collection Time: 08/05/21 11:51 AM   Specimen: Nasopharyngeal Swab; Nasopharyngeal(NP) swabs in vial transport medium  Result Value Ref Range   SARS Coronavirus 2 by RT PCR NEGATIVE NEGATIVE   Influenza A by PCR NEGATIVE NEGATIVE   Influenza B by PCR NEGATIVE NEGATIVE  CBC   Collection Time: 08/05/21 12:26 PM  Result Value Ref Range   WBC 7.6 4.0 - 10.5 K/uL   RBC 3.49 (L) 3.87 - 5.11 MIL/uL   Hemoglobin 11.2 (L) 12.0 - 15.0 g/dL   HCT 08/07/21 (L) 85.0 - 27.7 %   MCV 93.1 80.0 - 100.0 fL   MCH 32.1 26.0 - 34.0 pg   MCHC 34.5 30.0 - 36.0 g/dL   RDW 41.2 87.8 - 67.6 %   Platelets 134 (L) 150 - 400 K/uL   nRBC 0.0 0.0 - 0.2 %    Patient Active Problem List   Diagnosis Date Noted   Post term pregnancy 08/05/2021   History of gestational hypertension 07/07/2021   Limited prenatal care in second trimester 03/18/2021   Short interval between pregnancies affecting pregnancy, antepartum 03/18/2021   Language barrier  03/18/2021   Supervision of normal pregnancy 03/18/2021   Vaginal delivery 07/31/2020    Assessment/Plan:  Wanda Palmer is a 25 y.o. G3P2002 at [redacted]w[redacted]d here for SOL.   #Labor: Making progress. Would like to eat and get epidural and then would like AROM. #Pain: Planning epidural #FWB: Cat I #ID:  GBS neg #MOF: Bottle and breast #MOC: Originally hoping for BTL but not sure if she will be able to pay for this (uninsured). Discussed other methods of contraception including vasectomy,  IUD, Nexplanon. Patient will think about it and let provider know.   Reeves Forth, MD  08/05/2021, 2:31 PM  GME ATTESTATION:  I saw and evaluated the patient. I agree with the findings and the plan of care as documented in the resident's note. I have made changes to documentation as necessary.  Cat 1 FHT. Plan for AROM after epidural placement. Can add Pitocin as needed for augmentation.  Evalina Field, MD OB Fellow, Faculty [facility] 08/05/2021 4:14 PM

## 2021-08-05 NOTE — MAU Provider Note (Signed)
Ms. Traniya Prichett is a 25 y.o. [id number] female at [redacted]w[redacted]d weeks gestation presenting to MAU with complaints of contractions. MAU provider was requested by RN to verify presentation.  NST - FHR: 140 bpm / moderate variability / accels present / decels absent / TOCO: irregular UC's  Dilation: 5.5 Effacement (%): 80 Cervical Position: Posterior Station: -3 Presentation: Vertex (confirmed by bsus) Exam by:: Jeanice Lim Flippin RN   Patient informed that the ultrasound is considered a limited OB ultrasound and is not intended to be a complete ultrasound exam.  Patient also informed that the ultrasound is not being completed with the intent of assessing for fetal or placental anomalies or any pelvic abnormalities.  Explained that the purpose of today's ultrasound is to assess for presentation.  Baby was found to be in a cephalic presentation. Patient acknowledges the purpose of the exam and the limitations of the study.  Raelyn Mora, CNM  08/05/2021 10:30 AM

## 2021-08-05 NOTE — Anesthesia Procedure Notes (Signed)
Epidural Patient location during procedure: OB Start time: 08/05/2021 3:45 PM End time: 08/05/2021 3:55 PM  Staffing Anesthesiologist: Elmer Picker, MD Performed: anesthesiologist   Preanesthetic Checklist Completed: patient identified, IV checked, risks and benefits discussed, monitors and equipment checked, pre-op evaluation and timeout performed  Epidural Patient position: sitting Prep: DuraPrep and site prepped and draped Patient monitoring: continuous pulse ox, blood pressure, heart rate and cardiac monitor Approach: midline Location: L3-L4 Injection technique: LOR air  Needle:  Needle type: Tuohy  Needle gauge: 17 G Needle length: 9 cm Needle insertion depth: 5 cm Catheter type: closed end flexible Catheter size: 19 Gauge Catheter at skin depth: 10 cm Test dose: negative  Assessment Sensory level: T8 Events: blood not aspirated, injection not painful, no injection resistance, no paresthesia and negative IV test  Additional Notes Patient identified. Risks/Benefits/Options discussed with patient including but not limited to bleeding, infection, nerve damage, paralysis, failed block, incomplete pain control, headache, blood pressure changes, nausea, vomiting, reactions to medication both or allergic, itching and postpartum back pain. Confirmed with bedside nurse the patient's most recent platelet count. Confirmed with patient that they are not currently taking any anticoagulation, have any bleeding history or any family history of bleeding disorders. Patient expressed understanding and wished to proceed. All questions were answered. Sterile technique was used throughout the entire procedure. Please see nursing notes for vital signs. Test dose was given through epidural catheter and negative prior to continuing to dose epidural or start infusion. Warning signs of high block given to the patient including shortness of breath, tingling/numbness in hands, complete motor block,  or any concerning symptoms with instructions to call for help. Patient was given instructions on fall risk and not to get out of bed. All questions and concerns addressed with instructions to call with any issues or inadequate analgesia.  Reason for block:procedure for pain

## 2021-08-05 NOTE — Discharge Summary (Signed)
Postpartum Discharge Summary   Patient Name: Darsha Zumstein DOB: 1996-07-31 MRN: [id number]  Date of admission: 08/05/2021 Delivery date:08/05/2021  Delivering provider: Seabron Spates  Date of discharge: 08/07/2021  Admitting diagnosis: Post term pregnancy [O48.0] Intrauterine pregnancy: [redacted]w[redacted]d    Secondary diagnosis:  Principal Problem:   Post term pregnancy Active Problems:   Vaginal delivery   Short interval between pregnancies affecting pregnancy, antepartum   Language barrier   Supervision of normal pregnancy   History of gestational hypertension  Additional problems: Limited prenatal care, Language Barrier, history of Gestational Hypertension    Discharge diagnosis: Term Pregnancy Delivered                                              Post partum procedures: None Augmentation: AROM Complications: None  Hospital course: Onset of Labor With Vaginal Delivery      25y.o. yo [id number] admitted in Active Labor on 08/05/2021. Patient had an uncomplicated labor course as follows:  Membrane Rupture Time/Date: 4:44 PM ,08/05/2021   Delivery Method:Vaginal, Spontaneous  Episiotomy: None  Lacerations:  None  Patient had an uncomplicated postpartum course.  Her blood pressures during her stay remained normotensive and she did not meet criteria for medication (given her hx of gHTN with prior pregnancy her criteria is more than 3 BP in >135 or >95)She is ambulating, tolerating a regular diet, passing flatus, and urinating well. Patient is discharged home in stable condition on 08/07/21.  Newborn Data: Birth date:08/05/2021  Birth time:9:56 PM  Gender:Female  Living status:Living  Apgars:9 ,9  Weight:3660 g   Magnesium Sulfate received: No BMZ received: No Rhophylac:N/A MMR:N/A T-DaP:Given prenatally Flu: N/A Transfusion:No  Physical exam  Vitals:   08/06/21 0810 08/06/21 1300 08/06/21 2108 08/07/21 0550  BP: (!) 104/58 122/66 108/67 106/65   Pulse: 72 74 84 70  Resp: _0 Temp: 98.1 F (36.7 C) 98.1 F (36.7 C) 98 F (36.7 C) 98.1 F (36.7 C)  TempSrc:   Oral Oral  SpO2:   99% 100%  Weight:      Height:       General: alert Lochia: appropriate Uterine Fundus: firm Incision: N/A DVT Evaluation: No evidence of DVT seen on physical exam. Labs: Lab Results  Component Value Date   WBC 7.6 08/05/2021   HGB 11.2 (L) 08/05/2021   HCT 32.5 (L) 08/05/2021   MCV 93.1 08/05/2021   PLT 134 (L) 08/05/2021   CMP Latest Ref Rng & Units 04/11/2021  Glucose 70 - 99 mg/dL 90  BUN 6 - 20 mg/dL 5(L)  Creatinine 0.44 - 1.00 mg/dL 0.58  Sodium 135 - 145 mmol/L 137  Potassium 3.5 - 5.1 mmol/L 3.8  Chloride 98 - 111 mmol/L 108  CO2 22 - 32 mmol/L 23  Calcium 8.9 - 10.3 mg/dL 8.7(L)  Total Protein 6.5 - 8.1 g/dL 6.1(L)  Total Bilirubin 0.3 - 1.2 mg/dL 0.2(L)  Alkaline Phos 38 - 126 U/L 45  AST 15 - 41 U/L 16  ALT 0 - 44 U/L 17   Edinburgh Score: Edinburgh Postnatal Depression Scale Screening Tool 07/30/2020  I have been able to laugh and see the funny side of things. 0  I have looked forward with enjoyment to things. 0  I have blamed myself unnecessarily when things went wrong. 1  I have been anxious or worried for no good reason. 0  I have felt scared or panicky for no good reason. 0  Things have been getting on top of me. 1  I have been so unhappy that I have had difficulty sleeping. 0  I have felt sad or miserable. 0  I have been so unhappy that I have been crying. 0  The thought of harming myself has occurred to me. 0  Edinburgh Postnatal Depression Scale Total 2      After visit meds:  Allergies as of 08/07/2021       Reactions   Pork-derived Products Palpitations        Medication List     STOP taking these medications    aspirin EC 81 MG tablet   cefadroxil 500 MG capsule Commonly known as: DURICEF   famotidine 20 MG tablet Commonly known as: Pepcid   meclizine 12.5 MG  tablet Commonly known as: ANTIVERT       TAKE these medications    acetaminophen 325 MG tablet Commonly known as: Tylenol Take 2 tablets (650 mg total) by mouth every 4 (four) hours as needed (for pain scale < 4).   ibuprofen 600 MG tablet Commonly known as: ADVIL Take 1 tablet (600 mg total) by mouth every 6 (six) hours.   PRENATAL VITAMIN PO Take by mouth.         Discharge home in stable condition Infant Feeding: Breast Infant Disposition:home with mother Discharge instruction: per After Visit Summary and Postpartum booklet. Activity: Advance as tolerated. Pelvic rest for 6 weeks.  Diet: routine diet Anticipated Birth Control:  [facility] Nexplanon Postpartum Appointment:4 weeks Additional Postpartum F/U:  [facility] Nexplanon Future Appointments:No future appointments.  Follow up Visit: Message sent to [facility] by Dr. Cy Blamer on 08/07/2021    Renard Matter, MD, MPH OB Fellow, Faculty Practice

## 2021-08-05 NOTE — Anesthesia Preprocedure Evaluation (Signed)
Anesthesia Evaluation  Patient identified by MRN, date of birth, ID band Patient awake    Reviewed: Allergy & Precautions, NPO status , Patient's Chart, lab work & pertinent test results  Airway Mallampati: II  TM Distance: >3 FB Neck ROM: Full    Dental no notable dental hx.    Pulmonary neg pulmonary ROS,    Pulmonary exam normal breath sounds clear to auscultation       Cardiovascular negative cardio ROS Normal cardiovascular exam Rhythm:Regular Rate:Normal     Neuro/Psych negative neurological ROS  negative psych ROS   GI/Hepatic Neg liver ROS, GERD  ,  Endo/Other  negative endocrine ROS  Renal/GU negative Renal ROS  negative genitourinary   Musculoskeletal negative musculoskeletal ROS (+)   Abdominal   Peds  Hematology negative hematology ROS (+)   Anesthesia Other Findings   Reproductive/Obstetrics (+) Pregnancy                            Anesthesia Physical Anesthesia Plan  ASA: 2  Anesthesia Plan: Epidural   Post-op Pain Management:    Induction:   PONV Risk Score and Plan: Treatment may vary due to age or medical condition  Airway Management Planned: Natural Airway  Additional Equipment:   Intra-op Plan:   Post-operative Plan:   Informed Consent: I have reviewed the patients History and Physical, chart, labs and discussed the procedure including the risks, benefits and alternatives for the proposed anesthesia with the patient or authorized representative who has indicated his/her understanding and acceptance.       Plan Discussed with: Anesthesiologist  Anesthesia Plan Comments: (Patient identified. Risks, benefits, options discussed with patient including but not limited to bleeding, infection, nerve damage, paralysis, failed block, incomplete pain control, headache, blood pressure changes, nausea, vomiting, reactions to medication, itching, and post partum  back pain. Confirmed with bedside nurse the patient's most recent platelet count. Confirmed with the patient that they are not taking any anticoagulation, have any bleeding history or any family history of bleeding disorders. Patient expressed understanding and wishes to proceed. All questions were answered. )        Anesthesia Quick Evaluation  

## 2021-08-05 NOTE — MAU Note (Signed)
.  Wanda Palmer is a 25 y.o. at [redacted]w[redacted]d here in MAU reporting: ctx and some bloody show. Denies LOF. Endorses good fetal movement. Ctx every 30 minutes.   Pain score: 3 Vitals:   08/05/21 1011  BP: 123/74  Pulse: 79  Resp: 14  Temp: 98.4 F (36.9 C)  SpO2: 100%     FHT:143

## 2021-08-06 ENCOUNTER — Encounter (HOSPITAL_COMMUNITY): Payer: Self-pay | Admitting: Obstetrics and Gynecology

## 2021-08-06 LAB — RPR: RPR Ser Ql: NONREACTIVE

## 2021-08-06 MED ORDER — OXYCODONE HCL 5 MG PO TABS
5.0000 mg | ORAL_TABLET | Freq: Four times a day (QID) | ORAL | Status: DC | PRN
  Administered 2021-08-06 (×2): 5 mg via ORAL
  Filled 2021-08-06 (×2): qty 1

## 2021-08-06 NOTE — Progress Notes (Signed)
Post Partum Day 1 Subjective: no complaints, up ad lib, voiding, and tolerating PO  Objective: Blood pressure 120/63, pulse 63, temperature 98.2 F (36.8 C), temperature source Oral, resp. rate 16, height 5\' 2"  (1.575 m), weight 91.6 kg, last menstrual period 10/27/2020, SpO2 99 %, unknown if currently breastfeeding.  Physical Exam:  General: alert, cooperative, and no distress Lochia: appropriate Uterine Fundus: firm Incision: n/a DVT Evaluation: No evidence of DVT seen on physical exam.  Recent Labs    08/05/21 1226  HGB 11.2*  HCT 32.5*    Assessment/Plan: Plan for discharge tomorrow and Breastfeeding Wants BTL and husband states he is willing to prepay the cost of this, no matter what the price. He will need to talk to the Business Office today Epidural cath in but pump off    LOS: 1 day   08/07/21 08/06/2021, 7:15 AM

## 2021-08-06 NOTE — Anesthesia Postprocedure Evaluation (Signed)
Anesthesia Post Note  Patient: Wanda Palmer  Procedure(s) Performed: AN AD HOC LABOR EPIDURAL     Patient location during evaluation: Mother Baby Anesthesia Type: Epidural Level of consciousness: awake Pain management: satisfactory to patient Vital Signs Assessment: post-procedure vital signs reviewed and stable Respiratory status: spontaneous breathing Cardiovascular status: stable Anesthetic complications: no   No notable events documented.  Last Vitals:  Vitals:   08/06/21 0510 08/06/21 0810  BP: 120/63 (!) 104/58  Pulse: 63 72  Resp: 16 20  Temp: 36.8 C 36.7 C  SpO2:      Last Pain:  Vitals:   08/06/21 0810  TempSrc:   PainSc: 4    Pain Goal:                   Cephus Shelling

## 2021-08-06 NOTE — Lactation Note (Signed)
This note was copied from a baby's chart. Lactation Consultation Note  Patient Name: Girl Markeita Alicia OMBTD'H Date: 08/06/2021 Reason for consult: Initial assessment;Term Age:24 hours   LC Consult:  Spanish interpreter, Pieter Partridge [phone]) used for interpretation.  Mother was originally marked as being breast/formula.  After using the interpreter, mother informed me that she plans to formula feed only.  Supplementation guideline sheet at bedside and reviewed.  Mother is not currently a [facility] participant.  She did qualify in the past but has lost her card.  Suggested she may want to contact WIC on Monday to determine eligibility.  Mother stated that one of her other children preferred Enfamil and the other child prefereed Similac.  Mother remarked, "WIC only has Journalist, newspaper and my babies don't like that."    RN updated as to current feeding preference.   Maternal Data Has patient been taught Hand Expression?: No Does the patient have breastfeeding experience prior to this delivery?: Yes How long did the patient breastfeed?: 15 months with her first child, did not breast feed her second child  Feeding Mother's Current Feeding Choice: Formula  LATCH Score                    Lactation Tools Discussed/Used    Interventions Interventions: Education  Discharge WIC Program: No (Discussed contacting WIC as desired for assistance)  Consult Status Consult Status: Complete    Penny Frisbie R Jasin Brazel 08/06/2021, 12:28 PM

## 2021-08-06 NOTE — Progress Notes (Signed)
CSW received consult for late and limited PNC.  CSW reviewed chart and is screening out consult as it does not meet criteria for automatic CSW involvement and infant drug screening.  MOB started care prior to 28 weeks and had more than 3 visits.  Please contact CSW if current concerns arise or by MOB's request.   Azile Minardi Boyd-Gilyard, MSW, LCSW Clinical Social Work [phone] 

## 2021-08-07 MED ORDER — IBUPROFEN 600 MG PO TABS: 600.0000 mg | ORAL_TABLET | Freq: Four times a day (QID) | ORAL | 0 refills | Status: DC

## 2021-08-07 MED ORDER — ACETAMINOPHEN 325 MG PO TABS: 650.0000 mg | ORAL_TABLET | ORAL | 0 refills | Status: AC | PRN

## 2021-08-07 NOTE — Progress Notes (Signed)
CSW sent an email message to hospital Financial Counselor (Iris Montez Morita [phone]) to assist MOB with applying for Medicaid for MOB and infant.   Blaine Hamper, MSW, LCSW Clinical Social Work [phone]

## 2021-08-07 NOTE — Discharge Instructions (Addendum)
For your Nexplanon/implant for birth control please call [facility]) at  [phone] to schedule your appointment.

## 2021-08-10 ENCOUNTER — Encounter (HOSPITAL_COMMUNITY): Payer: Self-pay | Admitting: Obstetrics and Gynecology

## 2021-08-10 ENCOUNTER — Inpatient Hospital Stay (HOSPITAL_COMMUNITY): Payer: Self-pay

## 2021-08-10 ENCOUNTER — Inpatient Hospital Stay (HOSPITAL_COMMUNITY)
Admission: AD | Admit: 2021-08-10 | Discharge: 2021-08-10 | Disposition: A | Discharge status: Home / Self Care | Payer: Self-pay | Attending: Obstetrics and Gynecology | Admitting: Obstetrics and Gynecology

## 2021-08-10 ENCOUNTER — Inpatient Hospital Stay (HOSPITAL_COMMUNITY): Admission: AD | Admit: 2021-08-10 | Payer: Self-pay | Source: Home / Self Care | Admitting: Family Medicine

## 2021-08-10 DIAGNOSIS — O9089 Other complications of the puerperium, not elsewhere classified: Secondary | ICD-10-CM | POA: Insufficient documentation

## 2021-08-10 DIAGNOSIS — O8612 Endometritis following delivery: Secondary | ICD-10-CM | POA: Insufficient documentation

## 2021-08-10 LAB — URINALYSIS, ROUTINE W REFLEX MICROSCOPIC
Bilirubin Urine: NEGATIVE
Glucose, UA: NEGATIVE mg/dL
Ketones, ur: NEGATIVE mg/dL
Nitrite: NEGATIVE
Protein, ur: NEGATIVE mg/dL
Specific Gravity, Urine: 1.006 (ref 1.005–1.030)
pH: 7 (ref 5.0–8.0)

## 2021-08-10 MED ORDER — AMOXICILLIN-POT CLAVULANATE 875-125 MG PO TABS: 1.0000 | ORAL_TABLET | Freq: Two times a day (BID) | ORAL | 0 refills | Status: AC

## 2021-08-10 MED ORDER — TRAMADOL HCL 50 MG PO TABS
50.0000 mg | ORAL_TABLET | Freq: Once | ORAL | Status: AC
  Administered 2021-08-10: 50 mg via ORAL
  Filled 2021-08-10: qty 1

## 2021-08-10 NOTE — MAU Provider Note (Addendum)
History     CSN: [id number]  Arrival date and time: 08/10/21 1056   Event Date/Time   First Provider Initiated Contact with Patient 08/10/21 1145     HPI Wanda Palmer [name] is a 25 year-old G3P3003 s/p uncomplicated SVD on 11/18 who presents with sudden onset of lower abdominal pain since 9:00am this morning. She says the pain is constant and 9/10 in severity. No fever, chills, nausea, vomiting. No urinary symptoms including burning, frequency, or flank pain. No vaginal bleeding or vaginal discharge. She is eating and drinking normally. Had a BM this morning. She took both Tylenol and Ibuprofen for pain this morning with minimal to no relief.  Post-partum course unremarkable after discharge- no PPH or infection. She was to receive Nexplanon at follow-up appointment.  OB History     Gravida  3   Para  3   Term  3   Preterm  0   AB  0   Living  3      SAB  0   IAB  0   Ectopic  0   Multiple  0   Live Births  3           Past Medical History:  Diagnosis Date   Medical history non-contributory     Past Surgical History:  Procedure Laterality Date   NO PAST SURGERIES      Family History  Problem Relation Age of Onset   Learning disabilities Father     Social History   Tobacco Use   Smoking status: Never   Smokeless tobacco: Never  Vaping Use   Vaping Use: Never used  Substance Use Topics   Alcohol use: Not Currently   Drug use: Not Currently    Allergies:  Allergies  Allergen Reactions   Pork-Derived Products Palpitations    Medications Prior to Admission  Medication Sig Dispense Refill Last Dose   acetaminophen (TYLENOL) 325 MG tablet Take 2 tablets (650 mg total) by mouth every 4 (four) hours as needed (for pain scale < 4). 60 tablet 0    ibuprofen (ADVIL) 600 MG tablet Take 1 tablet (600 mg total) by mouth every 6 (six) hours. 30 tablet 0    Prenatal Vit-Fe Fumarate-FA (PRENATAL VITAMIN PO) Take by mouth.       Review of Systems   Constitutional:  Negative for chills and fever.  Gastrointestinal:  Positive for abdominal pain. Negative for constipation, nausea and vomiting.  Genitourinary:  Negative for difficulty urinating, dysuria, flank pain and vaginal bleeding.  Neurological:  Negative for dizziness and weakness.  Physical Exam   Blood pressure 120/75, pulse 71, temperature 98 F (36.7 C), resp. rate 18, unknown if currently breastfeeding.  Physical Exam Constitutional:      General: She is not in acute distress.    Appearance: She is not ill-appearing or diaphoretic.  Cardiovascular:     Rate and Rhythm: Normal rate.  Pulmonary:     Effort: Pulmonary effort is normal.  Abdominal:     General: There is no distension.     Palpations: Abdomen is soft. There is no mass.     Tenderness: There is abdominal tenderness in the right lower quadrant, suprapubic area and left lower quadrant. Negative signs include McBurney's sign.     Comments: Tearful with palpation of lower abdomen  Skin:    General: Skin is warm and dry.  Neurological:     General: No focal deficit present.     Mental Status: She is  alert.    MAU Course  Procedures Results for orders placed or performed during the hospital encounter of 08/10/21 (from the past 24 hour(s))  Urinalysis, Routine w reflex microscopic Urine, Clean Catch     Status: Abnormal   Collection Time: 08/10/21 12:55 PM  Result Value Ref Range   Color, Urine YELLOW YELLOW   APPearance CLEAR CLEAR   Specific Gravity, Urine 1.006 1.005 - 1.030   pH 7.0 5.0 - 8.0   Glucose, UA NEGATIVE NEGATIVE mg/dL   Hgb urine dipstick LARGE (A) NEGATIVE   Bilirubin Urine NEGATIVE NEGATIVE   Ketones, ur NEGATIVE NEGATIVE mg/dL   Protein, ur NEGATIVE NEGATIVE mg/dL   Nitrite NEGATIVE NEGATIVE   Leukocytes,Ua MODERATE (A) NEGATIVE   RBC / HPF 0-5 0 - 5 RBC/hpf   WBC, UA 11-20 0 - 5 WBC/hpf   Bacteria, UA RARE (A) NONE SEEN   Squamous Epithelial / LPF 0-5 0 - 5   Mucus PRESENT     Amorphous Crystal PRESENT    US PELVIS (TRANSABDOMINAL ONLY)  Result Date: 08/10/2021 CLINICAL DATA:  Postpartum pain. EXAM: TRANSABDOMINAL ULTRASOUND OF PELVIS TECHNIQUE: Transabdominal ultrasound examination of the pelvis was performed including evaluation of the uterus, ovaries, adnexal regions, and pelvic cul-de-sac. COMPARISON:  None. FINDINGS: Uterus Measurements: 15.6 x 13.2 x 7.0 cm = volume: 758 mL. No fibroids or other mass visualized. Endometrium Thickness: 9 mm.  No focal abnormality visualized. Right ovary Measurements: 3.3 x 3.7 x 1.8 cm = volume: 12 mL. Normal appearance/no adnexal mass. Left ovary Measurements: 3.2 x 2.1 x 2.5 cm = volume: 9 mL. Normal appearance/no adnexal mass. Other findings:  No abnormal free fluid. IMPRESSION: Enlarged uterus consistent with postpartum status. No other pelvic abnormality seen. Electronically Signed   By: Lupita Raider M.D.   On: 08/10/2021 13:16    MDM Sargun Rummell is a 25 year old G3P3003 who presented with lower abdominal pain with unremarkable imaging and UA in the MAU, and will treat for presumed endometritis.  Assessment and Plan  Presumed endometritis Afebrile, normotensive without tachycardia and non-toxic appearing, although with exquisite tenderness on abdominal exam. PO 50mg  Tramadol x1 given for pain relief. Differential includes endometritis, UTI or retained products after delivery. Transabdominal pelvic unremarkable for acute pelvic abnormality- 50mm endometrium thickness, no free fluid visualized. Urinalysis largely unremarkable for UTI (negative nitrites, WBC 5-10, rare bacteria, large Hgb urine dipstick). UA showed UTI on 07/23/2021 with E.coli on culture. - Will treat for presumed endometritis vs. unresolved UTI given exquisite tenderness on exam: Augmentin 875mg  BID for 10 days - Provided patient with MAU return precautions  13/01/2021 08/10/2021, 11:46 AM    Attestation of Supervision of Student:  I  confirm that I have verified the information documented in the  resident's note and that I have also personally reperformed the history, physical exam and all medical decision making activities.  I have verified that all services and findings are accurately documented in this student's note; and I agree with management and plan as outlined in the documentation. I have also made any necessary editorial changes.  History Husna Krone is a 25 y.o. G3P3003 at 5 days post partum who presents with abdominal pain. Reports sharp pelvic pains that started this morning around 930 am. Rates pain 9/10. Took tylenol & ibuprofen this morning without relief. Denies fever, dysuria, or vaginal bleeding. Denies n/v/d, or constipation.   Physical exam BP 120/75   Pulse 71   Temp 98 F (  36.7 C)   Resp 18   Physical Examination: General appearance - alert, well appearing, and in no distress Mental status - normal mood, behavior, speech, dress, motor activity, and thought processes Eyes - sclera anicteric Chest - normal respiratory effort Abdomen - tenderness noted over suprapubic area & uterus Skin - warm & dry   Assessment/Plan 1. Postpartum endometritis   2. Postpartum pain    -patient presents with abdominal pain & uterine tenderness s/p SVD. She is afebrile & denies vaginal bleeding. Given tramadol in MAU with good response. Will tx with augmentin for endometritis -u/a inconclusive. She had a urine culture earlier this month that was positive for e coli & was treated. She has no urinary complaints.    Judeth Horn, NP [facility] [employer], [facility] 08/10/2021 3:29 PM

## 2021-08-10 NOTE — MAU Note (Signed)
PP vag 11/18. Pt stated she started having lower abd pain about 30 min ago.pain is cramping and sharp. Bleeding is minimal. Reports regular bm.

## 2021-08-17 ENCOUNTER — Telehealth (HOSPITAL_COMMUNITY): Payer: Self-pay | Admitting: *Deleted

## 2021-08-17 NOTE — Telephone Encounter (Signed)
Attempted hospital discharge follow-up call with [facility] [address], [place] [phone]. A man answered the phone and stated that he was at work. Stated that there is no other number that can be used to reach patient at this time. Deforest Hoyles, RN, 08/17/21, [phone]

## 2021-09-13 ENCOUNTER — Ambulatory Visit (INDEPENDENT_AMBULATORY_CARE_PROVIDER_SITE_OTHER): Payer: Self-pay | Admitting: Family Medicine

## 2021-09-13 ENCOUNTER — Encounter: Payer: Self-pay | Admitting: Family Medicine

## 2021-09-13 ENCOUNTER — Other Ambulatory Visit: Payer: Self-pay

## 2021-09-13 NOTE — Patient Instructions (Signed)
It was wonderful seeing you today!  I am glad you and your baby are doing well.  I am sorry that she has COVID.  It is okay for you to return to sexual activity.  Be sure to follow-up with the health department regarding your Pap smears and your Nexplanon placement.  If you have any questions or concerns please call the clinic.  I hope you have a wonderful New Year's!

## 2021-09-13 NOTE — Progress Notes (Signed)
°  [facility] Postpartum Visit   Wanda Palmer is a 25 y.o. [phone] presenting for a postpartum visit.  She has the following concerns today: None at this time  She delivered via vaginal delivary at [redacted]w[redacted]d.  She reports her vaginal bleeding is completely stopped . She is formula feeding her infant. She feels she is bonding well. She is considering nexplanon for contraception.   Edinburgh Postnatal Depression Scale: 2 (10 or higher is positive)  Reviewed pregnancy and delivery course no complications in pregnancy or delivery.  -  Vitals:   09/13/21 1334  BP: 100/70  Pulse: 86  SpO2: 98%   Exam: Well-appearing 25 year old female in no acute distress.  Abdomen is soft and nontender, positive bowel sounds, uterus below pubic bone Pelvic exam: Patient deferred today  A/P:  Postpartum visit: patient is 5 weeks postpartum following a vaginal delivery. -Discussed patients delivery and complications -Patient had no lacerations in delivery.  -Patient has urinary incontinence? No -Patient is safe to resume physical and sexual activity -Patient does not want a pregnancy in the next year.  Desired family size is 3 children.  -Reviewed forms of contraception in tiered fashion. Patient desired  Nexplanon   . Scheduled for placement at health department.   -Return to sexual activity and contraception discussed as above.  -Discussed birth spacing of 18 months -Breastfeeding: No -Mood: Happy, pleasant   -Discussed sleep and fatigue management and encouraged family/community support.  -Postpartum vaccines: None  -Need for postpartum diabetes screening:  not indicated  (2 hour glucose tolerance testing between 6-12 weeks postpartum)  -Patient had prior Pap at health department.  She reports that it was less than 3 years ago.  She will follow-up with them on when her next one needs to occur. -Return in No follow-ups on file.

## 2021-12-22 ENCOUNTER — Other Ambulatory Visit: Payer: Self-pay

## 2021-12-22 ENCOUNTER — Emergency Department (HOSPITAL_COMMUNITY): Payer: Self-pay

## 2021-12-22 ENCOUNTER — Emergency Department (HOSPITAL_COMMUNITY)
Admission: EM | Admit: 2021-12-22 | Discharge: 2021-12-23 | Disposition: A | Discharge status: Home / Self Care | Payer: Self-pay | Attending: Emergency Medicine | Admitting: Emergency Medicine

## 2021-12-22 ENCOUNTER — Encounter (HOSPITAL_COMMUNITY): Payer: Self-pay

## 2021-12-22 DIAGNOSIS — R000 Tachycardia, unspecified: Secondary | ICD-10-CM | POA: Insufficient documentation

## 2021-12-22 DIAGNOSIS — R079 Chest pain, unspecified: Secondary | ICD-10-CM | POA: Insufficient documentation

## 2021-12-22 DIAGNOSIS — R0602 Shortness of breath: Secondary | ICD-10-CM | POA: Insufficient documentation

## 2021-12-22 DIAGNOSIS — R002 Palpitations: Secondary | ICD-10-CM | POA: Insufficient documentation

## 2021-12-22 HISTORY — DX: Gestational diabetes mellitus in pregnancy, unspecified control: O24.419

## 2021-12-22 HISTORY — DX: Hyperlipidemia, unspecified: E78.5

## 2021-12-22 LAB — PREGNANCY, URINE: Preg Test, Ur: NEGATIVE

## 2021-12-22 LAB — CBC WITH DIFFERENTIAL/PLATELET
Abs Immature Granulocytes: 0.03 10*3/uL (ref 0.00–0.07)
Basophils Absolute: 0 10*3/uL (ref 0.0–0.1)
Basophils Relative: 0 %
Eosinophils Absolute: 0 10*3/uL (ref 0.0–0.5)
Eosinophils Relative: 0 %
HCT: 42.8 % (ref 36.0–46.0)
Hemoglobin: 14.9 g/dL (ref 12.0–15.0)
Immature Granulocytes: 0 %
Lymphocytes Relative: 12 %
Lymphs Abs: 0.9 10*3/uL (ref 0.7–4.0)
MCH: 30.7 pg (ref 26.0–34.0)
MCHC: 34.8 g/dL (ref 30.0–36.0)
MCV: 88.1 fL (ref 80.0–100.0)
Monocytes Absolute: 0.1 10*3/uL (ref 0.1–1.0)
Monocytes Relative: 2 %
Neutro Abs: 6.6 10*3/uL (ref 1.7–7.7)
Neutrophils Relative %: 86 %
Platelets: 212 10*3/uL (ref 150–400)
RBC: 4.86 MIL/uL (ref 3.87–5.11)
RDW: 12.3 % (ref 11.5–15.5)
WBC: 7.6 10*3/uL (ref 4.0–10.5)
nRBC: 0 % (ref 0.0–0.2)

## 2021-12-22 LAB — LIPASE, BLOOD: Lipase: 38 U/L (ref 11–51)

## 2021-12-22 LAB — COMPREHENSIVE METABOLIC PANEL
ALT: 32 U/L (ref 0–44)
AST: 26 U/L (ref 15–41)
Albumin: 4.3 g/dL (ref 3.5–5.0)
Alkaline Phosphatase: 55 U/L (ref 38–126)
Anion gap: 7 (ref 5–15)
BUN: 8 mg/dL (ref 6–20)
CO2: 18 mmol/L — ABNORMAL LOW (ref 22–32)
Calcium: 9.3 mg/dL (ref 8.9–10.3)
Chloride: 117 mmol/L — ABNORMAL HIGH (ref 98–111)
Creatinine, Ser: 0.7 mg/dL (ref 0.44–1.00)
GFR, Estimated: >60 mL/min (ref >60)
Glucose, Bld: 159 mg/dL — ABNORMAL HIGH (ref 70–99)
Potassium: 3.9 mmol/L (ref 3.5–5.1)
Sodium: 142 mmol/L (ref 135–145)
Total Bilirubin: 0.6 mg/dL (ref 0.3–1.2)
Total Protein: 7.7 g/dL (ref 6.5–8.1)

## 2021-12-22 LAB — D-DIMER, QUANTITATIVE: D-Dimer, Quant: 1 ug/mL-FEU — ABNORMAL HIGH (ref 0.00–0.50)

## 2021-12-22 MED ORDER — SODIUM CHLORIDE 0.9 % IV BOLUS
1000.0000 mL | Freq: Once | INTRAVENOUS | Status: AC
  Administered 2021-12-22: 1000 mL via INTRAVENOUS

## 2021-12-22 NOTE — ED Triage Notes (Signed)
Pt arrived via [facility] from home for heart palpitations and jittery. Pt went to UC on [address]. Pt not sure the name of the UC. Pt was injected in the buttocks with a medication and not sure what medication it was. Pt states an hour after she got home from the UC, she started to have the heart palpitations, feel jittery and sleepy. Pt is A&Ox4. Pt is eupneic. ?

## 2021-12-22 NOTE — ED Provider Notes (Signed)
? ?Emergency Department Provider Note ? ? ?I have reviewed the triage vital signs and the nursing notes. ? ? ?HISTORY ? ?Chief Complaint ?Palpitations ? ?Video Spanish interpreter used for encounter ? ?HPI ?Wanda Palmer is a 26 y.o. female with past medical history reviewed below presents emergency department for evaluation of chest discomfort, palpitations, shortness of breath.  Symptoms began yesterday but seem to improve.  They returned today after she went to a local clinic.  She states that she went there is a sick visit and received 2 shots of an unknown medication.  She states other medicines were sent to the pharmacy but she did not fill them because she began to feel worse after receiving the 2 shots.  No rash or tongue/lip swelling.  She has had similar episodes in the past but they were brief and she did not seek medical care. No fever or chills.  ? ?Past Medical History:  ?Diagnosis Date  ? Hyperlipidemia   ? Medical history non-contributory   ? Pregnancy-induced diabetes   ? ? ?Review of Systems ? ?Constitutional: No fever/chills. Positive fatigue.  ?Eyes: No visual changes. ?ENT: No sore throat. ?Cardiovascular: Positive chest pain and palpitations.  ?Respiratory: Positive shortness of breath. ?Gastrointestinal: No abdominal pain.  No nausea, no vomiting.  No diarrhea.  No constipation. ?Genitourinary: Negative for dysuria. ?Musculoskeletal: Negative for back pain. ?Skin: Negative for rash. ?Neurological: Negative for headaches, focal weakness or numbness. ? ?____________________________________________ ? ? ?PHYSICAL EXAM: ? ?VITAL SIGNS: ?ED Triage Vitals  ?Enc Vitals Group  ?   BP 12/22/21 1837 119/72  ?   Pulse Rate 12/22/21 1837 (!) 112  ?   Resp 12/22/21 1837 20  ?   Temp 12/22/21 1837 97.7 ?F (36.5 ?C)  ?   Temp Source 12/22/21 1837 Temporal  ?   SpO2 12/22/21 1833 98 %  ?   Weight 12/22/21 1839 183 lb 6.8 oz (83.2 kg)  ?   Height 12/22/21 1839 5\' 3"  (1.6 m)  ? ?Constitutional: Alert  and oriented. Well appearing and in no acute distress. ?Eyes: Conjunctivae are normal.  ?Head: Atraumatic. ?Nose: No congestion/rhinnorhea. ?Mouth/Throat: Mucous membranes are moist.  ?Neck: No stridor.   ?Cardiovascular: Sinus tachycardia. Good peripheral circulation. Grossly normal heart sounds.   ?Respiratory: Normal respiratory effort.  No retractions. Lungs CTAB. ?Gastrointestinal: Soft and nontender. No distention.  ?Musculoskeletal: No lower extremity tenderness nor edema. No gross deformities of extremities. ?Neurologic:  Normal speech and language.  ?Skin:  Skin is warm, dry and intact. No rash noted. ? ? ?____________________________________________ ?  ?LABS ?(all labs ordered are listed, but only abnormal results are displayed) ? ?Labs Reviewed  ?COMPREHENSIVE METABOLIC PANEL - Abnormal; Notable for the following components:  ?    Result Value  ? Chloride 117 (*)   ? CO2 18 (*)   ? Glucose, Bld 159 (*)   ? All other components within normal limits  ?D-DIMER, QUANTITATIVE - Abnormal; Notable for the following components:  ? D-Dimer, Quant 1.00 (*)   ? All other components within normal limits  ?LIPASE, BLOOD  ?CBC WITH DIFFERENTIAL/PLATELET  ?PREGNANCY, URINE  ?TSH  ?T4, FREE  ? ?____________________________________________ ? ?EKG ? ? EKG Interpretation ? ?Date/Time:  Thursday December 22 2021 18:39:10 EDT ?Ventricular Rate:  119 ?PR Interval:  168 ?QRS Duration: 81 ?QT Interval:  327 ?QTC Calculation: 461 ?R Axis:   71 ?Text Interpretation: Sinus tachycardia Borderline Q waves in lateral leads Borderline T abnormalities, anterior leads Confirmed by Nanda Quinton (  PR:[id number]) on 12/22/2021 7:36:20 PM ?  ? ?  ? ? ?____________________________________________ ? ?RADIOLOGY ? ?DG Chest Portable 1 View ? ?Result Date: 12/22/2021 ?CLINICAL DATA:  Shortness of breath EXAM: PORTABLE CHEST 1 VIEW COMPARISON:  None. FINDINGS: The heart size and mediastinal contours are within normal limits. Both lungs are clear. The visualized  skeletal structures are unremarkable. IMPRESSION: No active disease. Electronically Signed   By: Inez Catalina M.D.   On: 12/22/2021 20:28   ? ?____________________________________________ ? ? ?PROCEDURES ? ?Procedure(s) performed:  ? ?Procedures ? ?None ?____________________________________________ ? ? ?INITIAL IMPRESSION / ASSESSMENT AND PLAN / ED COURSE ? ?Pertinent labs & imaging results that were available during my care of the patient were reviewed by me and considered in my medical decision making (see chart for details). ?  ?This patient is Presenting for Evaluation of CP, which does require a range of treatment options, and is a complaint that involves a high risk of morbidity and mortality. ? ?The Differential Diagnoses includes but is not exclusive to acute coronary syndrome, aortic dissection, pulmonary embolism, cardiac tamponade, community-acquired pneumonia, pericarditis, musculoskeletal chest wall pain, etc. ? ? ?Critical Interventions-  ?  ?Medications  ?sodium chloride 0.9 % bolus 1,000 mL (0 mLs Intravenous Stopped 12/23/21 0134)  ?iohexol (OMNIPAQUE) 350 MG/ML injection 65 mL (65 mLs Intravenous Contrast Given 12/23/21 0007)  ? ? ?Reassessment after intervention: HR improved after IVF.  ? ? ?I did obtain Additional Historical Information from husband at bedside. Meds given today and pharmacy meds are unknown. No AVS given for visit. ? ?I decided to review pertinent External Data, and in summary no records in our system from a clinic visit. ?  ?Clinical Laboratory Tests Ordered, included No anemia. No AKI. D dimer elevated to 1. Pregnancy negative.  ? ?Radiologic Tests Ordered, included CXR. I independently interpreted the images and agree with radiology interpretation.  ? ?Cardiac Monitor Tracing which shows sinus tachycardia. ? ? ?Social Determinants of Health Risk Denies EtOH or drugs.  ? ? ?Medical Decision Making: Summary:  ?Patient presents to the emergency department with chest pain,  palpitations, shortness of breath.  Does not appear to be an acute allergic reaction based on exam.  Rhythm here is sinus tachycardia.  Plan for IV fluids, screening blood work including D-dimer, pregnancy test, reassess. ? ?Care transferred to Dr. Leonette Monarch pending CTA and TSH. ? ?Disposition: discharge ? ?____________________________________________ ? ?FINAL CLINICAL IMPRESSION(S) / ED DIAGNOSES ? ?Final diagnoses:  ?Palpitations  ? ? ?Note:  This document was prepared using Dragon voice recognition software and may include unintentional dictation errors. ? ?Nanda Quinton, MD, FACEP ?Emergency Medicine ? ?  ?Margette Fast, MD ?12/23/21 1145 ? ?

## 2021-12-23 ENCOUNTER — Emergency Department (HOSPITAL_COMMUNITY): Payer: Self-pay

## 2021-12-23 LAB — T4, FREE: Free T4: 0.78 ng/dL (ref 0.61–1.12)

## 2021-12-23 LAB — TSH: TSH: 0.378 u[IU]/mL (ref 0.350–4.500)

## 2021-12-23 MED ORDER — IOHEXOL 350 MG/ML SOLN
65.0000 mL | Freq: Once | INTRAVENOUS | Status: AC | PRN
  Administered 2021-12-23: 65 mL via INTRAVENOUS

## 2021-12-23 NOTE — ED Provider Notes (Signed)
I assumed care of this patient.  Please see previous provider note for further details of Hx, PE.  Briefly patient is a 26 y.o. female who presented with palpitations.  Patient noted to be tachycardic with a positive D-dimer currently pending CTA and thyroid panel.  Rest of the work-up reassuring. ? ?CTA negative for PE. ?Thyroid panel WNL. ? ?The patient appears reasonably screened and/or stabilized for discharge and I doubt any other medical condition or other [facility] requiring further screening, evaluation, or treatment in the ED at this time prior to discharge. Safe for discharge with strict return precautions. ? ?Disposition: Discharge ? ?Condition: Good ? ?I have discussed the results, Dx and Tx plan with the patient/family who expressed understanding and agree(s) with the plan. Discharge instructions discussed at length. The patient/family was given strict return precautions who verbalized understanding of the instructions. No further questions at time of discharge.  ? ? ?ED Discharge Orders   ? ? None  ? ?  ? ? ?[facility] narcotic database reviewed and no active prescriptions noted. ? ? ?Follow Up: ?Primary care provider ? ?Go to  ?as scheduled ? ? ? ? ? ? ?  ?Fatima Blank, MD ?12/23/21 0112 ? ?

## 2021-12-24 ENCOUNTER — Other Ambulatory Visit: Payer: Self-pay

## 2021-12-24 ENCOUNTER — Encounter (HOSPITAL_BASED_OUTPATIENT_CLINIC_OR_DEPARTMENT_OTHER): Payer: Self-pay

## 2021-12-24 ENCOUNTER — Emergency Department (HOSPITAL_BASED_OUTPATIENT_CLINIC_OR_DEPARTMENT_OTHER)
Admission: EM | Admit: 2021-12-24 | Discharge: 2021-12-24 | Disposition: A | Discharge status: Home / Self Care | Payer: Self-pay | Attending: Emergency Medicine | Admitting: Emergency Medicine

## 2021-12-24 DIAGNOSIS — G44209 Tension-type headache, unspecified, not intractable: Secondary | ICD-10-CM | POA: Insufficient documentation

## 2021-12-24 DIAGNOSIS — R197 Diarrhea, unspecified: Secondary | ICD-10-CM

## 2021-12-24 LAB — CBC WITH DIFFERENTIAL/PLATELET
Abs Immature Granulocytes: 0.02 10*3/uL (ref 0.00–0.07)
Basophils Absolute: 0 10*3/uL (ref 0.0–0.1)
Basophils Relative: 0 %
Eosinophils Absolute: 0.6 10*3/uL — ABNORMAL HIGH (ref 0.0–0.5)
Eosinophils Relative: 6 %
HCT: 37.6 % (ref 36.0–46.0)
Hemoglobin: 13.1 g/dL (ref 12.0–15.0)
Immature Granulocytes: 0 %
Lymphocytes Relative: 40 %
Lymphs Abs: 3.7 10*3/uL (ref 0.7–4.0)
MCH: 30.5 pg (ref 26.0–34.0)
MCHC: 34.8 g/dL (ref 30.0–36.0)
MCV: 87.4 fL (ref 80.0–100.0)
Monocytes Absolute: 0.5 10*3/uL (ref 0.1–1.0)
Monocytes Relative: 6 %
Neutro Abs: 4.4 10*3/uL (ref 1.7–7.7)
Neutrophils Relative %: 48 %
Platelets: 227 10*3/uL (ref 150–400)
RBC: 4.3 MIL/uL (ref 3.87–5.11)
RDW: 12.9 % (ref 11.5–15.5)
WBC: 9.2 10*3/uL (ref 4.0–10.5)
nRBC: 0 % (ref 0.0–0.2)

## 2021-12-24 LAB — BASIC METABOLIC PANEL
Anion gap: 7 (ref 5–15)
BUN: 17 mg/dL (ref 6–20)
CO2: 21 mmol/L — ABNORMAL LOW (ref 22–32)
Calcium: 8.5 mg/dL — ABNORMAL LOW (ref 8.9–10.3)
Chloride: 110 mmol/L (ref 98–111)
Creatinine, Ser: 0.94 mg/dL (ref 0.44–1.00)
GFR, Estimated: >60 mL/min (ref >60)
Glucose, Bld: 93 mg/dL (ref 70–99)
Potassium: 3.8 mmol/L (ref 3.5–5.1)
Sodium: 138 mmol/L (ref 135–145)

## 2021-12-24 MED ORDER — DIPHENHYDRAMINE HCL 50 MG/ML IJ SOLN
25.0000 mg | Freq: Once | INTRAMUSCULAR | Status: AC
  Administered 2021-12-24: 25 mg via INTRAVENOUS
  Filled 2021-12-24: qty 1

## 2021-12-24 MED ORDER — KETOROLAC TROMETHAMINE 15 MG/ML IJ SOLN
15.0000 mg | Freq: Once | INTRAMUSCULAR | Status: AC
  Administered 2021-12-24: 15 mg via INTRAVENOUS
  Filled 2021-12-24: qty 1

## 2021-12-24 MED ORDER — ONDANSETRON HCL 4 MG/2ML IJ SOLN
4.0000 mg | Freq: Once | INTRAMUSCULAR | Status: AC
  Administered 2021-12-24: 4 mg via INTRAVENOUS
  Filled 2021-12-24: qty 2

## 2021-12-24 MED ORDER — LACTATED RINGERS IV BOLUS
1000.0000 mL | Freq: Once | INTRAVENOUS | Status: AC
  Administered 2021-12-24: 1000 mL via INTRAVENOUS

## 2021-12-24 MED ORDER — PROCHLORPERAZINE EDISYLATE 10 MG/2ML IJ SOLN
10.0000 mg | Freq: Once | INTRAMUSCULAR | Status: AC
  Administered 2021-12-24: 5 mg via INTRAVENOUS
  Filled 2021-12-24: qty 2

## 2021-12-24 NOTE — ED Triage Notes (Signed)
C/o headache since this morning. Also having vomiting/diarrhea. Seen on 4/6. ? ?Spanish interpreter Mikle Bosworth [phone] used during triage.  ?

## 2021-12-24 NOTE — ED Notes (Signed)
Pt started c/o feeling cold (shivering noted) after giving Benadryl; pt became pale/diaphoretic and vomited clear liquid; EDP contacted- Compazine order changed to Zofran; pt did receive 5mg  of Compazine before d/c'd ?

## 2021-12-24 NOTE — ED Provider Notes (Signed)
?[facility] EMERGENCY DEPARTMENT ?Provider Note ? ? ?CSN: [id number] ?Arrival date & time: 12/24/21  1855 ? ?  ? ?History ? ?Chief Complaint  ?Patient presents with  ? Headache  ? ? ?Wanda Palmer is a 26 y.o. female who presents to the ED for evaluation of headache started earlier this morning and diarrhea that started last night.  Patient notes headache is across the forehead, described as throbbing.  She took some Tylenol at home without any relief.  She is also had a few episodes of diarrhea and one episode of vomiting several hours ago.  She denies abdominal pain, chest pain, shortness of breath, back pain.  Patient was here in the emergency department 2 days ago for evaluation of palpitations and shortness of breath.  At that time she was found to be tachycardic to about 120 with a positive D-dimer however CTA was negative for PE.  Thyroid function normal at that time, negative pregnancy.  Her CMP was noted to have a bicarb of 18, glucose of 160.  After negative CT, patient was discharged.  Patient notes that the symptoms have resolved. ? ? ? ?Headache ? ?  ? ?Home Medications ?Prior to Admission medications   ?Medication Sig Start Date End Date Taking? Authorizing Provider  ?acetaminophen (TYLENOL) 325 MG tablet Take 2 tablets (650 mg total) by mouth every 4 (four) hours as needed (for pain scale < 4). 08/07/21   Warner Mccreedy, MD  ?ibuprofen (ADVIL) 600 MG tablet Take 1 tablet (600 mg total) by mouth every 6 (six) hours. 08/07/21   Warner Mccreedy, MD  ?Prenatal Vit-Fe Fumarate-FA (PRENATAL VITAMIN PO) Take by mouth.    [provider]  ?   ? ?Allergies    ?Pork-derived products   ? ?Review of Systems   ?Review of Systems  ?Neurological:  Positive for headaches.  ? ?Physical Exam ?Updated Vital Signs ?BP 107/62 (BP Location: Left Arm)   Pulse 68   Temp 98.2 ?F (36.8 ?C) (Oral)   Resp 18   Ht 5\' 3"  (1.6 m)   Wt 83.2 kg   SpO2 100%   BMI 32.49 kg/m?  ?Physical Exam ?Vitals and nursing  note reviewed.  ?Constitutional:   ?   General: She is not in acute distress. ?   Appearance: She is not ill-appearing.  ?HENT:  ?   Head: Atraumatic.  ?Eyes:  ?   Extraocular Movements: Extraocular movements intact.  ?   Conjunctiva/sclera: Conjunctivae normal.  ?   Pupils: Pupils are equal, round, and reactive to light.  ?Cardiovascular:  ?   Rate and Rhythm: Normal rate and regular rhythm.  ?   Pulses: Normal pulses.  ?   Heart sounds: No murmur heard. ?Pulmonary:  ?   Effort: Pulmonary effort is normal. No respiratory distress.  ?   Breath sounds: Normal breath sounds.  ?Abdominal:  ?   General: Abdomen is flat. There is no distension.  ?   Palpations: Abdomen is soft.  ?   Tenderness: There is no abdominal tenderness.  ?Musculoskeletal:     ?   General: Normal range of motion.  ?   Cervical back: Normal range of motion.  ?Skin: ?   General: Skin is warm and dry.  ?   Capillary Refill: Capillary refill takes less than 2 seconds.  ?Neurological:  ?   General: No focal deficit present.  ?   Mental Status: She is alert.  ?   Comments: Speech is clear, able to  follow commands ?CN III-XII intact ?Normal strength in upper and lower extremities bilaterally including dorsiflexion and plantar flexion, strong and equal grip strength ?Sensation normal to light and sharp touch ?Moves extremities without ataxia, coordination intact ?Normal finger to nose and rapid alternating movements ?No pronator drift ? ?  ?Psychiatric:     ?   Mood and Affect: Mood normal.  ? ? ?ED Results / Procedures / Treatments   ?Labs ?(all labs ordered are listed, but only abnormal results are displayed) ?Labs Reviewed  ?BASIC METABOLIC PANEL - Abnormal; Notable for the following components:  ?    Result Value  ? CO2 21 (*)   ? Calcium 8.5 (*)   ? All other components within normal limits  ?CBC WITH DIFFERENTIAL/PLATELET - Abnormal; Notable for the following components:  ? Eosinophils Absolute 0.6 (*)   ? All other components within normal limits   ? ? ?EKG ?None ? ?Radiology ?CT Angio Chest PE W and/or Wo Contrast ? ?Result Date: 12/23/2021 ?CLINICAL DATA:  Pulmonary embolism (PE) suspected, positive D-dimer EXAM: CT ANGIOGRAPHY CHEST WITH CONTRAST TECHNIQUE: Multidetector CT imaging of the chest was performed using the standard protocol during bolus administration of intravenous contrast. Multiplanar CT image reconstructions and MIPs were obtained to evaluate the vascular anatomy. RADIATION DOSE REDUCTION: This exam was performed according to the departmental dose-optimization program which includes automated exposure control, adjustment of the mA and/or kV according to patient size and/or use of iterative reconstruction technique. CONTRAST:  65mL OMNIPAQUE IOHEXOL 350 MG/ML SOLN COMPARISON:  None. FINDINGS: Cardiovascular: Adequate opacification of the pulmonary arterial tree. No intraluminal filling defect identified to suggest acute pulmonary embolism. Central pulmonary arteries are of normal caliber. No significant coronary artery calcification. Cardiac size within normal limits. No pericardial effusion. No significant atherosclerotic calcification within the thoracic aorta. No aortic aneurysm. Mediastinum/Nodes: No enlarged mediastinal, hilar, or axillary lymph nodes. Thyroid gland, trachea, and esophagus demonstrate no significant findings. Lungs/Pleura: Lungs are clear. No pleural effusion or pneumothorax. Upper Abdomen: No acute abnormality. Musculoskeletal: No chest wall abnormality. No acute or significant osseous findings. Review of the MIP images confirms the above findings. IMPRESSION: No pulmonary embolism.  No acute intrathoracic pathology identified. Electronically Signed   By: Helyn NumbersAshesh  Parikh M.D.   On: 12/23/2021 00:23   ? ?Procedures ?Procedures  ? ?Medications Ordered in ED ?Medications  ?lactated ringers bolus 1,000 mL (1,000 mLs Intravenous New Bag/Given 12/24/21 2134)  ?prochlorperazine (COMPAZINE) injection 10 mg (5 mg Intravenous Given  12/24/21 2157)  ?diphenhydrAMINE (BENADRYL) injection 25 mg (25 mg Intravenous Given 12/24/21 2141)  ?ketorolac (TORADOL) 15 MG/ML injection 15 mg (15 mg Intravenous Given 12/24/21 2139)  ?ondansetron [facility](ZOFRAN) injection 4 mg (4 mg Intravenous Given 12/24/21 2150)  ? ? ?ED Course/ Medical Decision Making/ A&P ?  ?                        ?Medical Decision Making ?Amount and/or Complexity of Data Reviewed ?Labs: ordered. ? ?Risk ?Prescription drug management. ? ? ?History:  ?Per HPI ?Social determinants of health: no health insurance ? ?Initial impression: ? ?This patient presents to the ED for concern of headache, this involves an extensive number of treatment options, and is a complaint that carries with it a high risk of complications and morbidity.   Emergent considerations for headache include subarachnoid hemorrhage, meningitis, temporal arteritis, glaucoma, cerebral ischemia, carotid/vertebral dissection, intracranial tumor, Venous sinus thrombosis, carbon monoxide poisoning, acute or chronic subdural hemorrhage.  Other considerations  include: Migraine, Cluster headache, Hypertension, Caffeine, alcohol, or drug withdrawal, Pseudotumor cerebri, Arteriovenous malformation, Head injury, Neurocysticercosis, Post-lumbar puncture, Preeclampsia, Tension headache, Sinusitis, Cervical arthritis, Refractive error causing strain, Dental abscess, Otitis media, Temporomandibular joint syndrome, Depression, Somatoform disorder (eg, somatization) Trigeminal neuralgia, Glossopharyngeal neuralgia. ? ? ?Cardiac Monitoring: ? ?The patient was maintained on a cardiac monitor.  I personally viewed and interpreted the cardiac monitored which showed an underlying rhythm of: NSR ? ? ?Medicines ordered and prescription drug management: ? ?I ordered medication including: ?Migraine cocktail to include Toradol 15 mg, Benadryl 25, Compazine 10 mg ?Zofran 4 mg for nausea ?Reevaluation of the patient after these medicines showed that the patient  improved ?I have reviewed the patients home medicines and have made adjustments as needed ? ? ?ED Course: ?26 year old female presents ED for evaluation of headache and diarrhea.  Vitals are normal.  Neuro exam n

## 2021-12-24 NOTE — ED Notes (Signed)
Pt resting more comfortably now

## 2021-12-24 NOTE — Discharge Instructions (Addendum)
Your work-up today was very reassuring - your labs are normal and look improved since two days ago.  I suspect that your headache is something called a tension headache, and I have attached some nformation here about it for you.  If this continues to be an issue, please follow-up with your primary care doctor. ? ?If her headache returns, you can try either 500mg  Tylenol (2 tablets) or 800mg  ibuprofen (4 tablets) ? ?Su evaluaci?n de hoy fue muy tranquilizadora: sus laboratorios son normales y se ven mejorados desde hace dos d?as. Sospecho que su dolor de cabeza es algo llamado dolor de cabeza por tensi?n, y he adjuntado informaci?n al respecto para usted. Si esto contin?a siendo un problema, haga un seguimiento con su m?dico de atenci?n primaria. ? ?Si su dolor de cabeza regresa, puede probar Tylenol de 500 mg (2 tabletas) o ibuprofeno de 800 mg (4 tabletas) ?

## 2022-05-24 ENCOUNTER — Encounter (HOSPITAL_BASED_OUTPATIENT_CLINIC_OR_DEPARTMENT_OTHER): Payer: Self-pay | Admitting: Emergency Medicine

## 2022-05-24 ENCOUNTER — Emergency Department (HOSPITAL_BASED_OUTPATIENT_CLINIC_OR_DEPARTMENT_OTHER)
Admission: EM | Admit: 2022-05-24 | Discharge: 2022-05-24 | Discharge status: Against Medical Advice | Payer: Self-pay | Attending: Emergency Medicine | Admitting: Emergency Medicine

## 2022-05-24 ENCOUNTER — Other Ambulatory Visit: Payer: Self-pay

## 2022-05-24 DIAGNOSIS — Z5321 Procedure and treatment not carried out due to patient leaving prior to being seen by health care provider: Secondary | ICD-10-CM | POA: Insufficient documentation

## 2022-05-24 DIAGNOSIS — M542 Cervicalgia: Secondary | ICD-10-CM | POA: Insufficient documentation

## 2022-05-24 NOTE — ED Triage Notes (Signed)
Left sided neck pain that radiates down to upper left back since this morning. Describes pain as sharp and tingling. States pain feels like a pulled muscle.

## 2022-08-12 IMAGING — CT CT ANGIO CHEST
2 of 6 series · 17 of 46 positions shown · IV contrast (agent unspecified)
Comparison: None.

CLINICAL DATA: Pulmonary embolism (PE) suspected, positive D-dimer

EXAM:
CT ANGIOGRAPHY CHEST WITH CONTRAST
TECHNIQUE: Multidetector CT imaging of the chest was performed using the
standard protocol during bolus administration of intravenous
contrast. Multiplanar CT image reconstructions and MIPs were
obtained to evaluate the vascular anatomy.

[Series 7: thins · axial · 0.70mm/px · z∈[+1242,+1437]mm · 14 of 215 slices shown]
[im 10/215  lung]
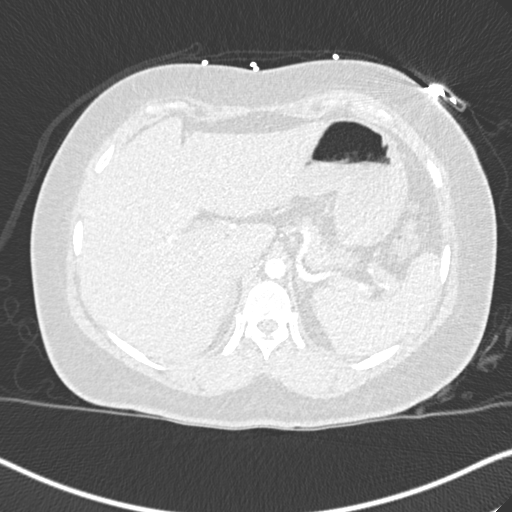
[im 28/215  soft-tissue]
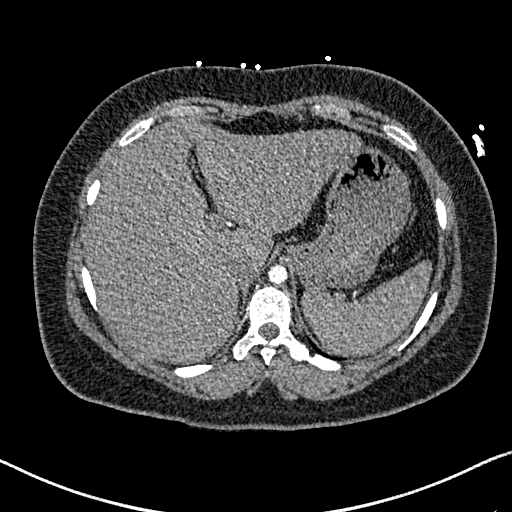
[im 38/215  lung]
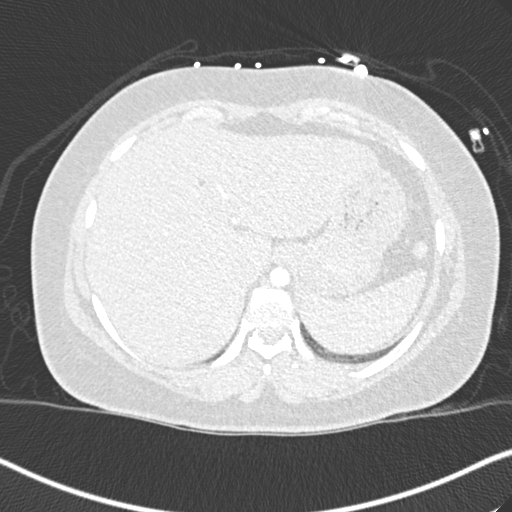
[im 56/215  soft-tissue]
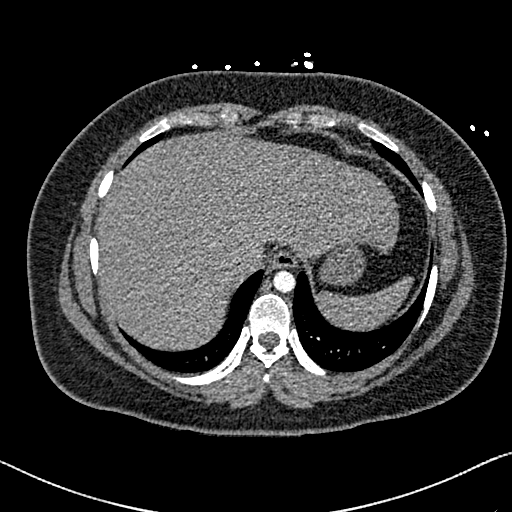
[im 75/215  lung]
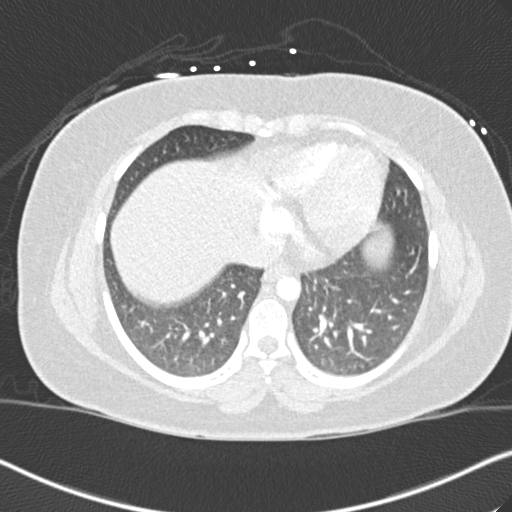
[im 84/215  soft-tissue]
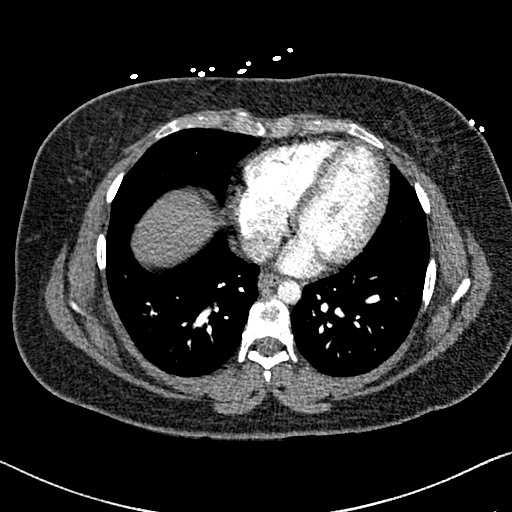
[im 103/215  lung]
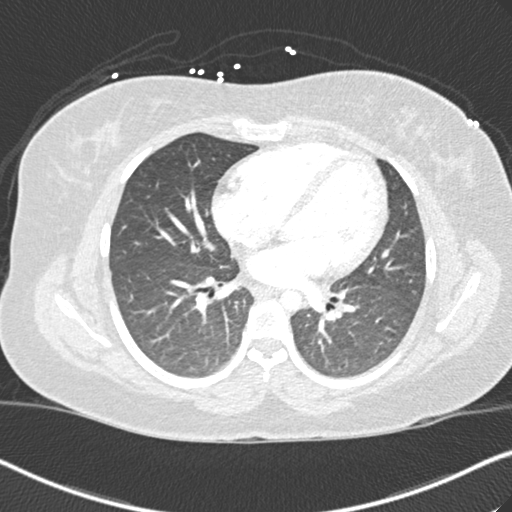
[im 112/215  soft-tissue]
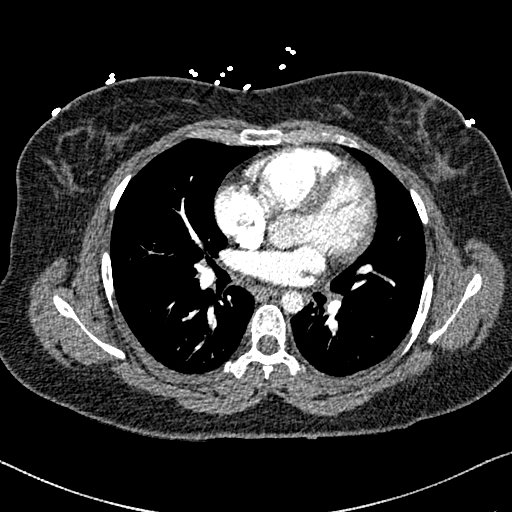
[im 131/215  lung]
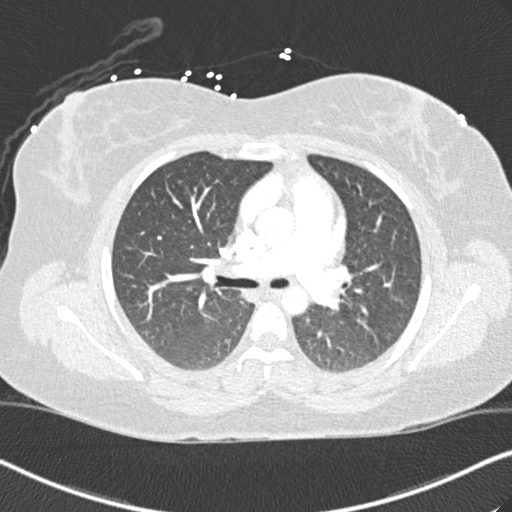
[im 140/215  soft-tissue]
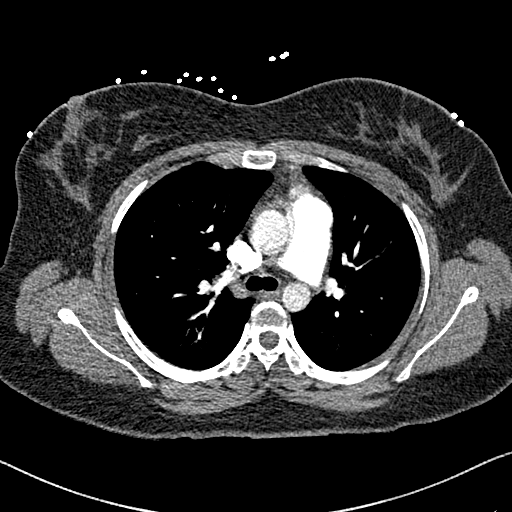
[im 159/215  lung]
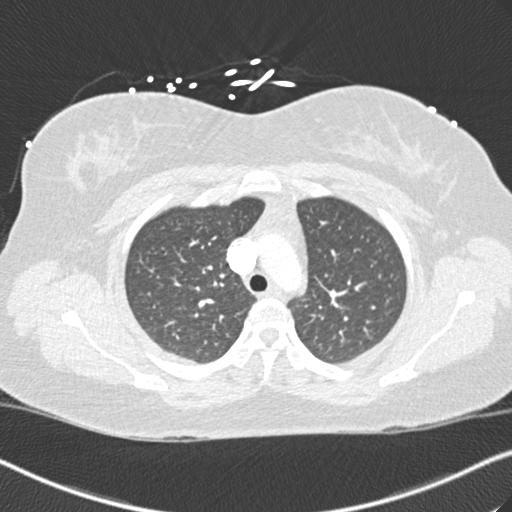
[im 177/215  soft-tissue]
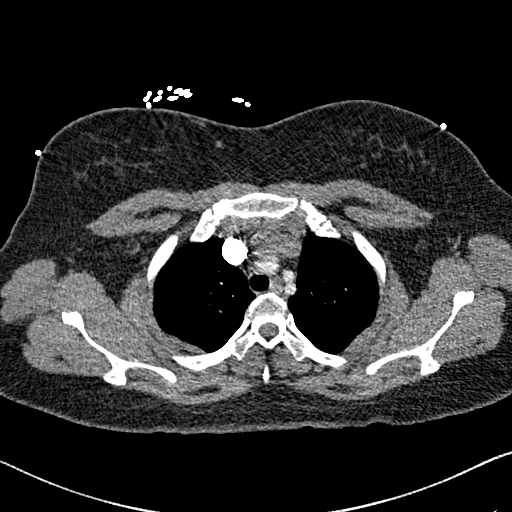
[im 187/215  lung]
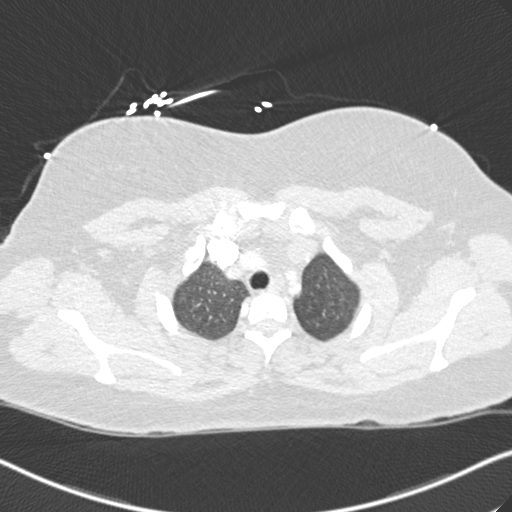
[im 205/215  soft-tissue]
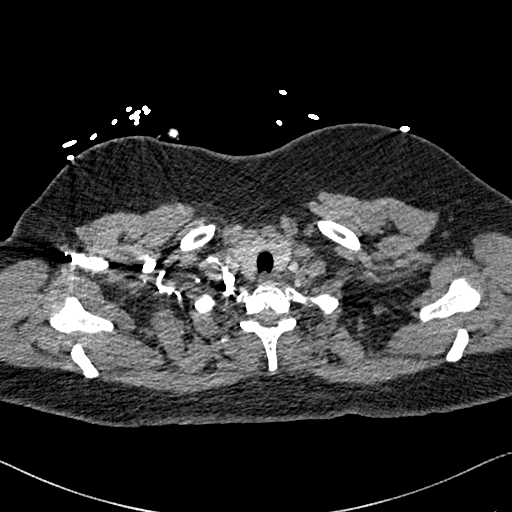

[Series 9: coronal mpr · coronal · 0.43mm/px · 3 of 135 slices shown]
[im 34/135  soft-tissue]
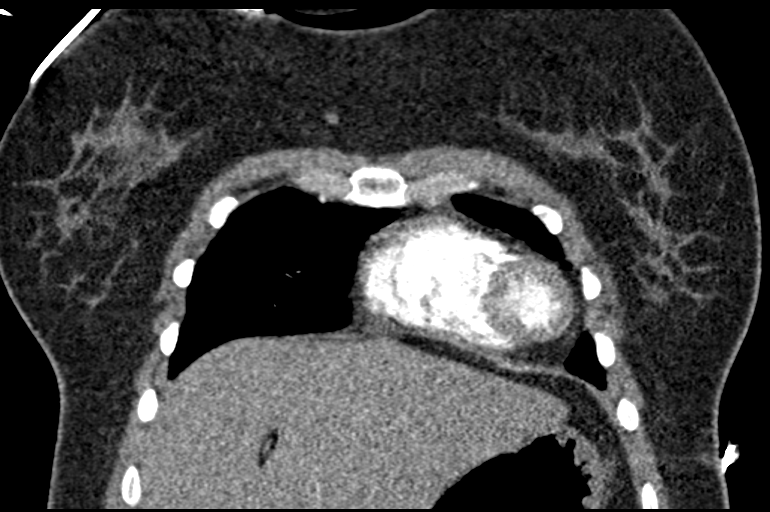
[im 68/135  soft-tissue]
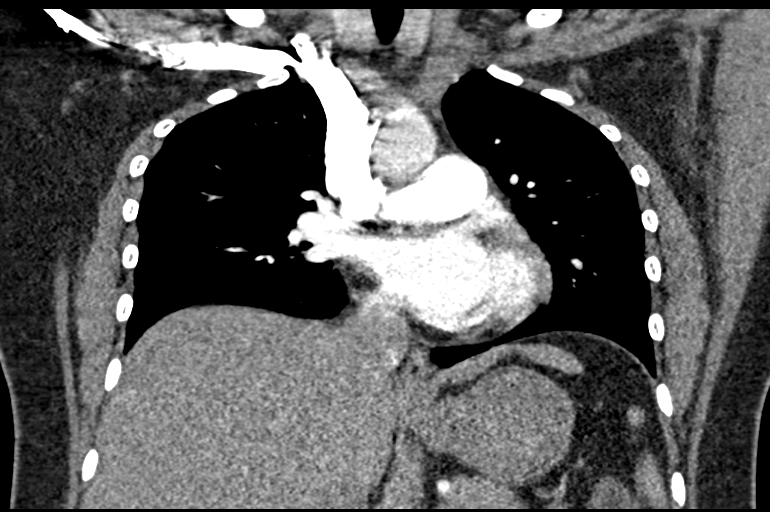
[im 101/135  soft-tissue]
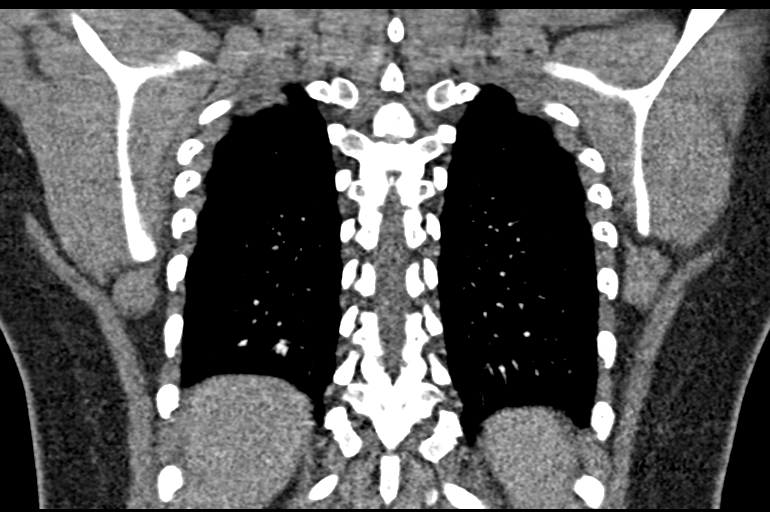

[17 of 46 positions shown; findings below may reference images not displayed]

RADIATION DOSE REDUCTION: This exam was performed according to the
departmental dose-optimization program which includes automated
exposure control, adjustment of the mA and/or kV according to
patient size and/or use of iterative reconstruction technique.

CONTRAST:  65mL OMNIPAQUE IOHEXOL 350 MG/ML SOLN
FINDINGS: Cardiovascular: Adequate opacification of the pulmonary arterial
tree. No intraluminal filling defect identified to suggest acute
pulmonary embolism. Central pulmonary arteries are of normal
caliber. No significant coronary artery calcification. Cardiac size
within normal limits. No pericardial effusion. No significant
atherosclerotic calcification within the thoracic aorta. No aortic
aneurysm.

Mediastinum/Nodes: No enlarged mediastinal, hilar, or axillary lymph
nodes. Thyroid gland, trachea, and esophagus demonstrate no
significant findings.

Lungs/Pleura: Lungs are clear. No pleural effusion or pneumothorax.

Upper Abdomen: No acute abnormality.

Musculoskeletal: No chest wall abnormality. No acute or significant
osseous findings.

Review of the MIP images confirms the above findings.
IMPRESSION: No pulmonary embolism.  No acute intrathoracic pathology identified.

## 2022-08-30 ENCOUNTER — Emergency Department (HOSPITAL_BASED_OUTPATIENT_CLINIC_OR_DEPARTMENT_OTHER)
Admission: EM | Admit: 2022-08-30 | Discharge: 2022-08-30 | Disposition: A | Discharge status: Home / Self Care | Payer: Self-pay | Attending: Emergency Medicine | Admitting: Emergency Medicine

## 2022-08-30 ENCOUNTER — Other Ambulatory Visit: Payer: Self-pay

## 2022-08-30 ENCOUNTER — Encounter (HOSPITAL_BASED_OUTPATIENT_CLINIC_OR_DEPARTMENT_OTHER): Payer: Self-pay | Admitting: Emergency Medicine

## 2022-08-30 DIAGNOSIS — R531 Weakness: Secondary | ICD-10-CM | POA: Insufficient documentation

## 2022-08-30 DIAGNOSIS — R112 Nausea with vomiting, unspecified: Secondary | ICD-10-CM | POA: Insufficient documentation

## 2022-08-30 DIAGNOSIS — Z20822 Contact with and (suspected) exposure to covid-19: Secondary | ICD-10-CM | POA: Insufficient documentation

## 2022-08-30 DIAGNOSIS — R519 Headache, unspecified: Secondary | ICD-10-CM | POA: Insufficient documentation

## 2022-08-30 LAB — CBC
HCT: 40.1 % (ref 36.0–46.0)
Hemoglobin: 13.7 g/dL (ref 12.0–15.0)
MCH: 30.7 pg (ref 26.0–34.0)
MCHC: 34.2 g/dL (ref 30.0–36.0)
MCV: 89.9 fL (ref 80.0–100.0)
Platelets: 226 10*3/uL (ref 150–400)
RBC: 4.46 MIL/uL (ref 3.87–5.11)
RDW: 12.4 % (ref 11.5–15.5)
WBC: 6.8 10*3/uL (ref 4.0–10.5)
nRBC: 0 % (ref 0.0–0.2)

## 2022-08-30 LAB — RESP PANEL BY RT-PCR (RSV, FLU A&B, COVID)  RVPGX2
Influenza A by PCR: NEGATIVE
Influenza B by PCR: NEGATIVE
Resp Syncytial Virus by PCR: NEGATIVE
SARS Coronavirus 2 by RT PCR: NEGATIVE

## 2022-08-30 LAB — URINALYSIS, ROUTINE W REFLEX MICROSCOPIC
Bilirubin Urine: NEGATIVE
Glucose, UA: NEGATIVE mg/dL
Ketones, ur: NEGATIVE mg/dL
Nitrite: NEGATIVE
Protein, ur: NEGATIVE mg/dL
Specific Gravity, Urine: 1.013 (ref 1.005–1.030)
pH: 6 (ref 5.0–8.0)

## 2022-08-30 LAB — COMPREHENSIVE METABOLIC PANEL
ALT: 17 U/L (ref 0–44)
AST: 16 U/L (ref 15–41)
Albumin: 4.4 g/dL (ref 3.5–5.0)
Alkaline Phosphatase: 57 U/L (ref 38–126)
Anion gap: 8 (ref 5–15)
BUN: 9 mg/dL (ref 6–20)
CO2: 24 mmol/L (ref 22–32)
Calcium: 8.8 mg/dL — ABNORMAL LOW (ref 8.9–10.3)
Chloride: 107 mmol/L (ref 98–111)
Creatinine, Ser: 0.62 mg/dL (ref 0.44–1.00)
GFR, Estimated: >60 mL/min (ref >60)
Glucose, Bld: 100 mg/dL — ABNORMAL HIGH (ref 70–99)
Potassium: 3.9 mmol/L (ref 3.5–5.1)
Sodium: 139 mmol/L (ref 135–145)
Total Bilirubin: 0.3 mg/dL (ref 0.3–1.2)
Total Protein: 7.2 g/dL (ref 6.5–8.1)

## 2022-08-30 LAB — PREGNANCY, URINE: Preg Test, Ur: NEGATIVE

## 2022-08-30 LAB — LIPASE, BLOOD: Lipase: 50 U/L (ref 11–51)

## 2022-08-30 MED ORDER — CEPHALEXIN 500 MG PO CAPS: 500.0000 mg | ORAL_CAPSULE | Freq: Two times a day (BID) | ORAL | 0 refills | Status: AC

## 2022-08-30 MED ORDER — KETOROLAC TROMETHAMINE 15 MG/ML IJ SOLN
15.0000 mg | Freq: Once | INTRAMUSCULAR | Status: AC
  Administered 2022-08-30: 15 mg via INTRAMUSCULAR
  Filled 2022-08-30: qty 1

## 2022-08-30 MED ORDER — NAPROXEN 500 MG PO TABS: 500.0000 mg | ORAL_TABLET | Freq: Two times a day (BID) | ORAL | 0 refills | Status: AC

## 2022-08-30 NOTE — Discharge Instructions (Addendum)
Le he recetado medicina para [employer]. Tome [employer] veces al dia por los siguientes 7 dias.  Le he recetado antibiotico para ayudar con su presumta infeccion urinaria. Tome 1 pastilla diara los siguientes 7 [address].   Le he dado un referido para el doctor de cabezera, haga una cita para hacerse sus chequeos.

## 2022-08-30 NOTE — ED Triage Notes (Signed)
Feeling weak headache, nausea vomiting diarrhea. Started 2 days ago. Patient most concerned with pain on side of head, headache that comes and goes

## 2022-08-30 NOTE — ED Notes (Signed)
Discharge paperwork given and verbally understood. 

## 2022-09-06 NOTE — ED Provider Notes (Signed)
[facility] EMERGENCY DEPT Provider Note   CSN: [id number] Arrival date & time: 08/30/22  1406     History  No chief complaint on file.   Wanda Palmer is a 26 y.o. female.  26 y.o female with no PMH presents to the ED with a chief complaint of weakness, headache, nausea, vomiting along with multiple other complaints. Patient reports there have been going on for some time. She does not have a primary care physician that she sees and also has not seek medical care in sometime. 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 She reports having a right sided headache pain stabbing in nature which waxes and wanes with tylenol. She was told previously of having maybe migraines? But never had this evaluated. Weakness is generalized and has been ongoing for several months. She reports being under a lot of stress. No prior hx of CAD, aneurysm , anemia or other complaints reported.   The history is provided by the patient and medical records.       Home Medications Prior to Admission medications   Medication Sig Start Date End Date Taking? Authorizing Provider  cephALEXin (KEFLEX) 500 MG capsule Take 1 capsule (500 mg total) by mouth 2 (two) times daily for 7 days. 08/30/22 09/06/22 Yes Vuong Musa, PA-C  naproxen (NAPROSYN) 500 MG tablet Take 1 tablet (500 mg total) by mouth 2 (two) times daily for 7 days. 08/30/22 09/06/22 Yes Khaza Blansett, Leonie Douglas, PA-C  acetaminophen (TYLENOL) 325 MG tablet Take 2 tablets (650 mg total) by mouth every 4 (four) hours as needed (for pain scale < 4). 08/07/21   Warner Mccreedy, MD  Prenatal Vit-Fe Fumarate-FA (PRENATAL VITAMIN PO) Take by mouth.    [provider]      Allergies    Pork-derived products    Review of Systems   Review of Systems  Constitutional:  Negative for fever.  HENT:  Negative for sore throat.   Respiratory:  Negative for shortness of breath.   Cardiovascular:   Negative for chest pain.  Gastrointestinal:  Positive for nausea and vomiting. Negative for abdominal pain.  Genitourinary:  Negative for flank pain.  Musculoskeletal:  Positive for back pain.  Skin:  Negative for pallor and wound.  Neurological:  Positive for headaches. Negative for dizziness and syncope.  All other systems reviewed and are negative.   Physical Exam Updated Vital Signs BP 114/68 (BP Location: Right Arm)   Pulse 66   Temp 98.1 F (36.7 C) (Oral)   Resp 16   LMP 08/24/2022   SpO2 100%  Physical Exam Vitals and nursing note reviewed.  Constitutional:      General: She is not in acute distress.    Appearance: She is well-developed.     Comments: Tearful on exam as children cry in the room.   HENT:     Head: Normocephalic and atraumatic.     Mouth/Throat:     Pharynx: No oropharyngeal exudate.  Eyes:     Pupils: Pupils are equal, round, and reactive to light.  Cardiovascular:     Rate and Rhythm: Regular rhythm.     Heart sounds: Normal heart sounds.  Pulmonary:     Effort: Pulmonary effort is normal. No respiratory distress.     Breath sounds: Normal breath sounds.  Abdominal:     General: Bowel sounds are normal. There is no distension.     Palpations: Abdomen is soft.  Tenderness: There is no abdominal tenderness.  Musculoskeletal:        General: No tenderness or deformity.     Cervical back: Normal range of motion.     Right lower leg: No edema.     Left lower leg: No edema.  Skin:    General: Skin is warm and dry.  Neurological:     Mental Status: She is alert and oriented to person, place, and time.     Comments: Alert, oriented, thought content appropriate. Speech fluent without evidence of aphasia. Able to follow 2 step commands without difficulty.  Cranial Nerves:  II:  Peripheral visual fields grossly normal, pupils, round, reactive to light III,IV, VI: ptosis not present, extra-ocular motions intact bilaterally  V,VII: smile symmetric,  facial light touch sensation equal VIII: hearing grossly normal bilaterally  IX,X: midline uvula rise  XI: bilateral shoulder shrug equal and strong XII: midline tongue extension  Motor:  5/5 in upper and lower extremities bilaterally including strong and equal grip strength and dorsiflexion/plantar flexion Sensory: light touch normal in all extremities.  Cerebellar: normal finger-to-nose with bilateral upper extremities, pronator drift negative Gait: normal gait and balance    Psychiatric:        Attention and Perception: Attention normal.        Mood and Affect: Affect is tearful.        Speech: Speech is not delayed.        Behavior: Behavior normal.        Thought Content: Thought content does not include homicidal or suicidal ideation. Thought content does not include homicidal or suicidal plan.     ED Results / Procedures / Treatments   Labs (all labs ordered are listed, but only abnormal results are displayed) Labs Reviewed  COMPREHENSIVE METABOLIC PANEL - Abnormal; Notable for the following components:      Result Value   Glucose, Bld 100 (*)    Calcium 8.8 (*)    All other components within normal limits  URINALYSIS, ROUTINE W REFLEX MICROSCOPIC - Abnormal; Notable for the following components:   Hgb urine dipstick MODERATE (*)    Leukocytes,Ua SMALL (*)    Bacteria, UA FEW (*)    All other components within normal limits  RESP PANEL BY RT-PCR (RSV, FLU A&B, COVID)  RVPGX2  LIPASE, BLOOD  CBC  PREGNANCY, URINE    EKG None  Radiology No results found.  Procedures Procedures    Medications Ordered in ED Medications  ketorolac (TORADOL) 15 MG/ML injection 15 mg (15 mg Intramuscular Given 08/30/22 1756)    ED Course/ Medical Decision Making/ A&P                           Medical Decision Making Amount and/or Complexity of Data Reviewed Labs: ordered.  Risk Prescription drug management.    Patient presents to the ED with multiple complaints  including headache, nausea, vomiting, back pain has been ongoing for several months.  Patient does not have any primary care established, she has not seek medical attention in several years, unless this was for a gynecological complaint if she does have a 67-year-old in the room.  Patient is accompanied by her 3 children all are in the age of 33.  She does appear very overwhelmed during her primary interview.  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  She does report a prior history of migraines perhaps however it is unclear if this was diagnosed in her country back home.  She also endorses back pain, worsened whenever she is picking up her small children daily.  She is not having any CVA tenderness on my exam, her exam is otherwise benign.  Neurological exam is unremarkable.  Labs were interpreted and reviewed by me CBC is without any abnormal findings.  CMP with no electrolyte derangement, creatinine levels within normal limits.  UA does have some moderate hemoglobin, small leukocytes and few bacteria, she does have some urinary frequency, will opted for treatment at this time with Keflex.  She also reports generalized weakness, I do suspect this is likely ongoing from the UTI or superimposed infection.  Although, respiratory panel was negative on today's visit.  Pregnancy test is also negative.  She is requesting counseling, along with therapy to help her manage stress at home.  I do believe this is likely a psychological component at this time.  Given a referral for the [facility] in order to establish primary care physician.  Her vitals are stable, otherwise in stable condition given a shot of Toradol to help with her back pain along with outpatient referral to [facility] health [facility] wellness clinic.  Patient is hemodynamically stable for discharge.   Portions of this  note were generated with Scientist, clinical (histocompatibility and immunogenetics). Dictation errors may occur despite best attempts at proofreading.   Final Clinical Impression(s) / ED Diagnoses Final diagnoses:  Bad headache  Nausea and vomiting, unspecified vomiting type    Rx / DC Orders ED Discharge Orders          Ordered    naproxen (NAPROSYN) 500 MG tablet  2 times daily        08/30/22 1751    cephALEXin (KEFLEX) 500 MG capsule  2 times daily        08/30/22 1751              Claude Manges, PA-C 09/06/22 1549    Glyn Ade, MD 09/08/22 1526

## 2022-10-27 ENCOUNTER — Emergency Department (HOSPITAL_BASED_OUTPATIENT_CLINIC_OR_DEPARTMENT_OTHER): Payer: Self-pay

## 2022-10-27 ENCOUNTER — Emergency Department (HOSPITAL_BASED_OUTPATIENT_CLINIC_OR_DEPARTMENT_OTHER)
Admission: EM | Admit: 2022-10-27 | Discharge: 2022-10-27 | Disposition: A | Discharge status: Home / Self Care | Payer: Self-pay | Attending: Emergency Medicine | Admitting: Emergency Medicine

## 2022-10-27 ENCOUNTER — Other Ambulatory Visit: Payer: Self-pay

## 2022-10-27 DIAGNOSIS — Z1152 Encounter for screening for COVID-19: Secondary | ICD-10-CM | POA: Insufficient documentation

## 2022-10-27 DIAGNOSIS — R519 Headache, unspecified: Secondary | ICD-10-CM

## 2022-10-27 DIAGNOSIS — B349 Viral infection, unspecified: Secondary | ICD-10-CM | POA: Insufficient documentation

## 2022-10-27 LAB — RESP PANEL BY RT-PCR (RSV, FLU A&B, COVID)  RVPGX2
Influenza A by PCR: NEGATIVE
Influenza B by PCR: NEGATIVE
Resp Syncytial Virus by PCR: NEGATIVE
SARS Coronavirus 2 by RT PCR: NEGATIVE

## 2022-10-27 MED ORDER — KETOROLAC TROMETHAMINE 15 MG/ML IJ SOLN
15.0000 mg | Freq: Once | INTRAMUSCULAR | Status: AC
  Administered 2022-10-27: 15 mg via INTRAMUSCULAR
  Filled 2022-10-27: qty 1

## 2022-10-27 MED ORDER — METOCLOPRAMIDE HCL 10 MG PO TABS
10.0000 mg | ORAL_TABLET | Freq: Once | ORAL | Status: AC
  Administered 2022-10-27: 10 mg via ORAL
  Filled 2022-10-27: qty 1

## 2022-10-27 MED ORDER — DEXAMETHASONE 4 MG PO TABS
6.0000 mg | ORAL_TABLET | Freq: Once | ORAL | Status: AC
  Administered 2022-10-27: 6 mg via ORAL
  Filled 2022-10-27: qty 2

## 2022-10-27 NOTE — ED Notes (Signed)
Patient transported to X-ray 

## 2022-10-27 NOTE — ED Notes (Addendum)
Used female bystander as Optometrist for patient, due to [place] only.   5 hours ago, HA and chills. No home meds. Same thing last year, evaluated by doctor. No fevers either time.

## 2022-10-27 NOTE — Discharge Instructions (Signed)
Thank you for letting us take care of you today. We treated your headache. Your tests and x-ray today did not find a cause for your symptoms. I suspect you are dealing with a viral illness from our discussion. With the headache you described, I am concerned you may be having migraine headaches. I recommend you establish a relationship with a primary care provider to follow up with. I have provided the names of 2 clinics you may contact to find a primary care provider. Please try to have an appointment with one of these clinics in the next week if possible.   Gracias por dejarnos cuidar de usted hoy. Tratamos tu dolor de Pensions consultant. Sus pruebas y radiografas de hoy no encontraron la causa de sus sntomas. Sospecho que ests lidiando con una enfermedad viral por nuestra discusin. Con el dolor de cabeza que usted describi, me preocupa que pueda tener migraas. Le recomiendo que establezca una relacin con un proveedor de atencin primaria para Patent attorney. He proporcionado los nombres de 2 clnicas con las que puede comunicarse para Pension scheme manager un proveedor de Midwife. Intente programar una cita con una de estas clnicas la prxima semana si es posible.  Viral Illness TREATMENT  Treatment is directed at relieving symptoms. There is no cure. Antibiotics are not effective because the infection is caused by a virus, not by bacteria. Treatment may include:  Increased fluid intake. Sports drinks offer valuable electrolytes, sugars, and fluids.  Breathing heated mist or steam (vaporizer or shower).  Eating chicken soup or other clear broths, and maintaining good nutrition.  Getting plenty of rest.  Using gargles or lozenges for comfort.  Increasing usage of your inhaler if you have asthma.  Return to work when your temperature has returned to normal.  Gargle warm salt water and spit it out for sore throat. Take benadryl to decrease sinus secretions. Continue to alternate between Tylenol and  ibuprofen for pain and fever control.  Follow Up: Follow up with your primary care doctor in 5-7 days for recheck of ongoing symptoms.  Return to emergency department for emergent changing or worsening of symptoms.   Enfermedad viral TRATAMIENTO El tratamiento est dirigido a Psychologist, educational. No existe cura. Los antibiticos no son eficaces porque la infeccin es causada por un virus, no por una bacteria. El tratamiento puede incluir:  Aumento de la ingesta de lquidos. Las bebidas deportivas ofrecen valiosos electrolitos, azcares y lquidos.  Respirar niebla o vapor caliente (vaporizador o ducha).  Comer sopa de pollo u otros caldos claros y [employer] buena nutricin.  Descansar lo suficiente.  Hacer grgaras o pastillas para mayor comodidad.  Aumentar el uso de su inhalador si tiene asma.  Regrese al [employer] cuando su temperatura haya vuelto a la normalidad.  Haga grgaras con agua tibia con sal y escpala para el dolor de garganta. Tome benadryl para disminuir las secreciones de los senos nasales. Contine alternando Tylenol e ibuprofeno para Financial controller y la fiebre.  Seguimiento: haga un seguimiento con su mdico de atencin primaria en 5 a 7 das para volver a Conservator, museum/gallery. Regrese al departamento de emergencias si los sntomas [place] o empeoran de Social worker.

## 2022-10-27 NOTE — ED Provider Notes (Signed)
[place] EMERGENCY DEPARTMENT AT [facility] Provider Note   CSN: [id number] Arrival date & time: 10/27/22  1723  Female family member at bedside used for Spanish interpretation with permission of patient.   History  Chief Complaint  Patient presents with   Chills    Wanda Palmer is a 27 y.o. female with no PMH presenting to ED with fatigue, chills, and headache that started today. No known sick contacts. She denies fever, cough, congestion, abdominal pain, dysuria, chest pain, neck pain, shortness of breath, nausea, vomiting, diarrhea, focal weakness, vision changes, dizziness, lightheadedness, syncope, or other symptoms. Pt c/o mostly of headache described as left retro-orbital and frontal but without photophobia or phonophobia. She has had these headaches in the past but they usually resolve with Tylenol at home. No prior evaluation by PCP or neurology for headaches.     Home Medications Prior to Admission medications   Medication Sig Start Date End Date Taking? Authorizing Provider  acetaminophen (TYLENOL) 325 MG tablet Take 2 tablets (650 mg total) by mouth every 4 (four) hours as needed (for pain scale < 4). 08/07/21   Renard Matter, MD  Prenatal Vit-Fe Fumarate-FA (PRENATAL VITAMIN PO) Take by mouth.    [provider]      Allergies    Pork-derived products    Review of Systems   Review of Systems  All other systems reviewed and are negative.   Physical Exam Updated Vital Signs BP 127/61   Pulse 70   Temp 98.1 F (36.7 C) (Oral)   Resp 15   Ht 5' 2"$  (1.575 m)   Wt 80.7 kg   SpO2 100%   BMI 32.56 kg/m  Physical Exam Vitals and nursing note reviewed.  Constitutional:      General: She is not in acute distress.    Appearance: Normal appearance. She is not ill-appearing, toxic-appearing or diaphoretic.  HENT:     Head: Normocephalic and atraumatic.     Nose: Nose normal. No congestion or rhinorrhea.     Mouth/Throat:     Mouth:  Mucous membranes are moist.     Pharynx: Oropharynx is clear. No oropharyngeal exudate or posterior oropharyngeal erythema.  Eyes:     General: No scleral icterus.       Right eye: No discharge.        Left eye: No discharge.     Extraocular Movements: Extraocular movements intact.     Conjunctiva/sclera: Conjunctivae normal.     Pupils: Pupils are equal, round, and reactive to light.  Cardiovascular:     Rate and Rhythm: Normal rate and regular rhythm.     Heart sounds: No murmur heard. Pulmonary:     Effort: Pulmonary effort is normal. No respiratory distress.     Breath sounds: No stridor. Rales (questionable to left lung fields) present. No wheezing or rhonchi.  Chest:     Chest wall: No tenderness.  Abdominal:     General: Abdomen is flat. There is no distension.     Palpations: Abdomen is soft.     Tenderness: There is no abdominal tenderness. There is no right CVA tenderness, left CVA tenderness, guarding or rebound.  Musculoskeletal:        General: No tenderness. Normal range of motion.     Cervical back: Normal range of motion and neck supple. No rigidity.     Right lower leg: No edema.     Left lower leg: No edema.  Skin:    General:  Skin is warm and dry.     Capillary Refill: Capillary refill takes less than 2 seconds.     Coloration: Skin is not jaundiced or pale.     Findings: No rash.  Neurological:     General: No focal deficit present.     Mental Status: She is alert. Mental status is at baseline.     GCS: GCS eye subscore is 4. GCS verbal subscore is 5. GCS motor subscore is 6.     Cranial Nerves: Cranial nerves 2-12 are intact. No cranial nerve deficit, dysarthria or facial asymmetry.     Motor: Motor function is intact. No weakness or abnormal muscle tone.  Psychiatric:        Mood and Affect: Mood normal.        Behavior: Behavior normal.     ED Results / Procedures / Treatments   Labs (all labs ordered are listed, but only abnormal results are  displayed) Labs Reviewed  RESP PANEL BY RT-PCR (RSV, FLU A&B, COVID)  RVPGX2    EKG Sinus rhythm at 69bpm, normal intervals, no ST-T changes, no STEMI  Radiology DG Chest 2 View  Result Date: 10/27/2022 CLINICAL DATA:  rales left lung EXAM: CHEST - 2 VIEW COMPARISON:  12/22/2021 FINDINGS: The heart size and mediastinal contours are within normal limits. Both lungs are clear. The visualized skeletal structures are unremarkable. IMPRESSION: No active cardiopulmonary disease. Electronically Signed   By: Davina Poke D.O.   On: 10/27/2022 21:55    Procedures Procedures   Medications Ordered in ED Medications  dexamethasone (DECADRON) tablet 6 mg (6 mg Oral Given 10/27/22 2133)  metoCLOPramide (REGLAN) tablet 10 mg (10 mg Oral Given 10/27/22 2132)  ketorolac (TORADOL) 15 MG/ML injection 15 mg (15 mg Intramuscular Given 10/27/22 2133)    ED Course/ Medical Decision Making/ A&P                           Medical Decision Making Amount and/or Complexity of Data Reviewed Labs: ordered. Decision-making details documented in ED Course. Radiology: ordered. Decision-making details documented in ED Course. ECG/medicine tests: ordered. Decision-making details documented in ED Course.  Medical Decision Making:   Wanda Palmer is a 27 y.o. female who presented to the ED today with symptoms as detailed above with chief complaint being a headache.    Additional history discussed with patient's family/caregivers.  External chart has been reviewed including prior ED visits. Patient placed on continuous vitals and telemetry monitoring while in ED which was reviewed periodically.  Complete initial physical exam performed, notably the patient  was neurologically intact and in no acute distress.    Reviewed and confirmed nursing documentation for past medical history, family history, social history.    Initial Assessment:   With the patient's presentation of headache, most likely diagnosis is  migraine headache. Other diagnoses were considered including (but not limited to) subarachnoid hemorrhage, meningitis, temporal arteritis, glaucoma, cerebral ischemia, intracranial tumor, acute or chronic subdural hemorrhage, cluster headache, hypertension, caffeine, alcohol, or drug withdrawal, pseudotumor cerebri, head injury, preeclampsia, tension headache, sinusitis, depression, somatoform disorder (eg, somatization), trigeminal neuralgia. These are considered less likely due to history of present illness and physical exam findings.   This is most consistent with an acute complicated illness  Initial Plan:  Viral swabs CXR to evaluate for structural/infectious intrathoracic pathology.  EKG to evaluate for cardiac pathology Objective evaluation as reviewed   Initial Study Results:   Laboratory  All laboratory results reviewed without evidence of clinically relevant pathology.    EKG EKG was reviewed independently. Rate, rhythm, axis, intervals all examined and without medically relevant abnormality. ST segments without concerns for elevations.    Radiology:  All images reviewed independently. Agree with radiology report at this time.   DG Chest 2 View  Result Date: 10/27/2022 CLINICAL DATA:  rales left lung EXAM: CHEST - 2 VIEW COMPARISON:  12/22/2021 FINDINGS: The heart size and mediastinal contours are within normal limits. Both lungs are clear. The visualized skeletal structures are unremarkable. IMPRESSION: No active cardiopulmonary disease. Electronically Signed   By: Davina Poke D.O.   On: 10/27/2022 21:55    Final Assessment and Plan:   This is a 27 year old female presenting to ED with chief complaint of a headache. Headache similar to headaches pt has had in the past and described as left frontal-retroorbital and unrelieved with Tylenol at home. On exam, pt is neurologically intact with no meningismus or other signs of distress. Other symptoms somewhat vague so obtained viral  swabs, EKG, and chest x-ray for further evaluation which were all unremarkable. Toradol, Reglan, and Decadron ordered with good headache relief on re-examination. Pt did express to me that she eats only one meal a day and does not drink very much due to being busy mom of young children. She does not appear clinically dehydrated on exam and is tolerating PO intake. Encouraged adequate caloric intake and hydration. Abdominal exam benign and pt without any GI or GU complaints. Blood pressure on lower end of normal but upon chart review this appears normal for pt and she denies any symptoms such as lightheadedness, dizziness, shortness of breath, and on re-examination confirms her headache is improved and she is feeling better so I do believe she is stable for discharge at this time. Discussed with pt concern for migraine headache due to characteristics and history she describes. Provided with information for PCP follow up and encouraged her to set up appointment to see primary care and discuss today's visit as well as any ongoing concerns she has with headaches or other symptoms. Strict ED return precautions given. All questions answered, pt stable for discharge.    Clinical Impression:  1. Viral illness   2. Acute nonintractable headache, unspecified headache type      Discharge    Final Clinical Impression(s) / ED Diagnoses Final diagnoses:  Viral illness  Acute nonintractable headache, unspecified headache type    Rx / DC Orders ED Discharge Orders     None         Suzzette Righter, PA-C 10/28/22 0238    Fransico Meadow, MD 10/28/22 1536

## 2022-10-27 NOTE — ED Triage Notes (Signed)
Patient presents to ED via POV from home. Here with chills and fatigue that began today.
